# Patient Record
Sex: Female | Born: 1965 | Race: Black or African American | Hispanic: No | Marital: Married | Smoking: Former smoker
Health system: Southern US, Community
[De-identification: ages and names within clinical notes are randomized; demographics above are authoritative.]

## PROBLEM LIST (undated history)

## (undated) DIAGNOSIS — I10 Essential (primary) hypertension: Secondary | ICD-10-CM

## (undated) DIAGNOSIS — F329 Major depressive disorder, single episode, unspecified: Secondary | ICD-10-CM

## (undated) DIAGNOSIS — F32A Depression, unspecified: Secondary | ICD-10-CM

## (undated) DIAGNOSIS — E0590 Thyrotoxicosis, unspecified without thyrotoxic crisis or storm: Secondary | ICD-10-CM

## (undated) DIAGNOSIS — I219 Acute myocardial infarction, unspecified: Secondary | ICD-10-CM

## (undated) DIAGNOSIS — E079 Disorder of thyroid, unspecified: Secondary | ICD-10-CM

## (undated) HISTORY — PX: ECTOPIC PREGNANCY SURGERY: SHX613

---
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
## 2017-03-11 ENCOUNTER — Other Ambulatory Visit: Payer: Self-pay

## 2017-03-11 ENCOUNTER — Emergency Department (HOSPITAL_COMMUNITY)
Admission: EM | Admit: 2017-03-11 | Discharge: 2017-03-11 | Disposition: A | Discharge status: Home / Self Care | Payer: 59 | Attending: Emergency Medicine | Admitting: Emergency Medicine

## 2017-03-11 ENCOUNTER — Encounter (HOSPITAL_COMMUNITY): Payer: Self-pay | Admitting: Emergency Medicine

## 2017-03-11 DIAGNOSIS — I10 Essential (primary) hypertension: Secondary | ICD-10-CM

## 2017-03-11 DIAGNOSIS — R197 Diarrhea, unspecified: Secondary | ICD-10-CM

## 2017-03-11 DIAGNOSIS — Z87891 Personal history of nicotine dependence: Secondary | ICD-10-CM | POA: Insufficient documentation

## 2017-03-11 DIAGNOSIS — K921 Melena: Secondary | ICD-10-CM | POA: Insufficient documentation

## 2017-03-11 HISTORY — DX: Acute myocardial infarction, unspecified: I21.9

## 2017-03-11 HISTORY — DX: Essential (primary) hypertension: I10

## 2017-03-11 LAB — CBC
HEMATOCRIT: 37.9 % (ref 36.0–46.0)
HEMOGLOBIN: 13 g/dL (ref 12.0–15.0)
MCH: 29.5 pg (ref 26.0–34.0)
MCHC: 34.3 g/dL (ref 30.0–36.0)
MCV: 86.1 fL (ref 78.0–100.0)
Platelets: 232 10*3/uL (ref 150–400)
RBC: 4.4 MIL/uL (ref 3.87–5.11)
RDW: 13 % (ref 11.5–15.5)
WBC: 3.6 10*3/uL — ABNORMAL LOW (ref 4.0–10.5)

## 2017-03-11 LAB — COMPREHENSIVE METABOLIC PANEL
ALT: 17 U/L (ref 14–54)
AST: 23 U/L (ref 15–41)
Albumin: 4 g/dL (ref 3.5–5.0)
Alkaline Phosphatase: 73 U/L (ref 38–126)
Anion gap: 7 (ref 5–15)
BILIRUBIN TOTAL: 0.5 mg/dL (ref 0.3–1.2)
BUN: 9 mg/dL (ref 6–20)
CALCIUM: 9 mg/dL (ref 8.9–10.3)
CHLORIDE: 105 mmol/L (ref 101–111)
CO2: 26 mmol/L (ref 22–32)
CREATININE: 0.78 mg/dL (ref 0.44–1.00)
Glucose, Bld: 118 mg/dL — ABNORMAL HIGH (ref 65–99)
Potassium: 3.4 mmol/L — ABNORMAL LOW (ref 3.5–5.1)
Sodium: 138 mmol/L (ref 135–145)
TOTAL PROTEIN: 6.7 g/dL (ref 6.5–8.1)

## 2017-03-11 LAB — TYPE AND SCREEN
ABO/RH(D): O POS
Antibody Screen: NEGATIVE

## 2017-03-11 LAB — POC OCCULT BLOOD, ED: Fecal Occult Bld: NEGATIVE

## 2017-03-11 LAB — ABO/RH: ABO/RH(D): O POS

## 2017-03-11 LAB — I-STAT BETA HCG BLOOD, ED (MC, WL, AP ONLY)

## 2017-03-11 NOTE — ED Triage Notes (Signed)
Reports having blood in stool this morning and another this evening.  Denies having any other symptoms.

## 2017-03-11 NOTE — ED Provider Notes (Signed)
[facility] Provider Note   CSN: [id number] Arrival date & time: 03/11/17  0024     History   Chief Complaint Chief Complaint  Patient presents with  . Blood In Stools    HPI Molly Anderson is a 51 y.o. female.  The history is provided by the patient.  She had diarrhea yesterday morning, and again this evening.  On both occasions, after she finished her bowel movement, she had some blood that was dripping into the toilet bowl.  She denies abdominal pain, fever, chills, sweats.  She denies nausea or vomiting.  She is not sure if there was blood in the stool prior to the dripping at the end.  She denies any sick contacts.  She denies any unusual food exposures.  Past Medical History:  Diagnosis Date  . Hypertension   . MI (myocardial infarction) (HCC)     There are no active problems to display for this patient.   Past Surgical History:  Procedure Laterality Date  . ECTOPIC PREGNANCY SURGERY Left     OB History    No data available       Home Medications    Prior to Admission medications   Not on File    Family History No family history on file.  Social History Social History   Tobacco Use  . Smoking status: Former Games developermoker  . Smokeless tobacco: Never Used  Substance Use Topics  . Alcohol use: No    Frequency: Never  . Drug use: No     Allergies   Patient has no known allergies.   Review of Systems Review of Systems  All other systems reviewed and are negative.    Physical Exam Updated Vital Signs BP (!) 169/108 (BP Location: Left Arm)   Pulse 79   Temp 98.4 F (36.9 C) (Oral)   Resp 16   Ht 5\' 3"  (1.6 m)   Wt 80.3 kg (177 lb)   SpO2 100%   BMI 31.35 kg/m   Physical Exam  Nursing note and vitals reviewed.  51 year old female, resting comfortably and in no acute distress. Vital signs are significant for hypertension. Oxygen saturation is 100%, which is normal. Head is normocephalic and  atraumatic. PERRLA, EOMI. Oropharynx is clear. Neck is nontender and supple without adenopathy or JVD. Back is nontender and there is no CVA tenderness. Lungs are clear without rales, wheezes, or rhonchi. Chest is nontender. Heart has regular rate and rhythm without murmur. Abdomen is soft, flat, nontender without masses or hepatosplenomegaly and peristalsis is normoactive. Rectal: External hemorrhoids present.  Normal sphincter tone.  No stool present in the rectal ampulla. Extremities have no cyanosis or edema, full range of motion is present. Skin is warm and dry without rash. Neurologic: Mental status is normal, cranial nerves are intact, there are no motor or sensory deficits.  ED Treatments / Results  Labs (all labs ordered are listed, but only abnormal results are displayed) Labs Reviewed  COMPREHENSIVE METABOLIC PANEL - Abnormal; Notable for the following components:      Result Value   Potassium 3.4 (*)    Glucose, Bld 118 (*)    All other components within normal limits  CBC - Abnormal; Notable for the following components:   WBC 3.6 (*)    All other components within normal limits  POC OCCULT BLOOD, ED  I-STAT BETA HCG BLOOD, ED ([facility], [facility], [facility] ONLY)  POC OCCULT BLOOD, ED  TYPE AND SCREEN  ABO/RH  Procedures Procedures (including critical care time)  Medications Ordered in ED Medications - No data to display   Initial Impression / Assessment and Plan / ED Course  I have reviewed the triage vital signs and the nursing notes.  Pertinent labs & imaging results that were available during my care of the patient were reviewed by me and considered in my medical decision making (see chart for details).  Diarrhea with report of bleeding.  Based on history and exam, I suspect that bleeding is from her external hemorrhoids.  No active bleeding at the time of my exam.  Stool has been sent for Hemoccult testing.  She has no prior [facility] the [facility].  Hemoccult  is negative.  Orthostatic vital signs showed no significant change in heart rate or blood pressure.  Blood pressure is noted to be significantly elevated.  Patient takes lisinopril for blood pressure, but does not monitor her blood pressure at home.  She is discharged with instructions to take loperamide as needed for diarrhea, return precautions discussed.  Recommended that she monitor her blood pressure at home.  She states that she does not have a PCP, but recently did get insurance through her job.  She is given phone number for [facility] to try to establish primary care.  Final Clinical Impressions(s) / ED Diagnoses   Final diagnoses:  Diarrhea of presumed infectious origin  Hematochezia  Essential hypertension    ED Discharge Orders    None       Dione BoozeGlick, Stephanie Littman, MD 03/11/17 [phone]

## 2017-03-11 NOTE — Discharge Instructions (Signed)
Take loperamide (Imodium AD) as needed for diarrhea.  Please monitor your blood pressure at home. Take your blood pressure once a day, and keep a log of the readings. Take that log with you when you see your doctor.

## 2019-06-01 ENCOUNTER — Other Ambulatory Visit: Payer: Self-pay | Admitting: Gastroenterology

## 2019-06-01 DIAGNOSIS — R1032 Left lower quadrant pain: Secondary | ICD-10-CM

## 2019-06-13 ENCOUNTER — Ambulatory Visit
Admission: RE | Admit: 2019-06-13 | Discharge: 2019-06-13 | Disposition: A | Discharge status: Home / Self Care | Payer: BC Managed Care – PPO | Source: Ambulatory Visit | Attending: Gastroenterology | Admitting: Gastroenterology

## 2019-06-13 ENCOUNTER — Other Ambulatory Visit: Payer: Self-pay

## 2019-06-13 DIAGNOSIS — R1032 Left lower quadrant pain: Secondary | ICD-10-CM

## 2019-06-13 MED ORDER — IOPAMIDOL (ISOVUE-300) INJECTION 61%
100.0000 mL | Freq: Once | INTRAVENOUS | Status: AC | PRN
  Administered 2019-06-13: 100 mL via INTRAVENOUS

## 2019-06-22 ENCOUNTER — Other Ambulatory Visit: Payer: Self-pay

## 2019-06-22 ENCOUNTER — Emergency Department (HOSPITAL_COMMUNITY)
Admission: EM | Admit: 2019-06-22 | Discharge: 2019-06-23 | Disposition: A | Discharge status: Home / Self Care | Payer: BC Managed Care – PPO | Attending: Emergency Medicine | Admitting: Emergency Medicine

## 2019-06-22 ENCOUNTER — Encounter (HOSPITAL_COMMUNITY): Payer: Self-pay | Admitting: Emergency Medicine

## 2019-06-22 DIAGNOSIS — K5792 Diverticulitis of intestine, part unspecified, without perforation or abscess without bleeding: Secondary | ICD-10-CM | POA: Insufficient documentation

## 2019-06-22 DIAGNOSIS — Z7982 Long term (current) use of aspirin: Secondary | ICD-10-CM | POA: Insufficient documentation

## 2019-06-22 DIAGNOSIS — Z87891 Personal history of nicotine dependence: Secondary | ICD-10-CM | POA: Diagnosis not present

## 2019-06-22 DIAGNOSIS — Z79899 Other long term (current) drug therapy: Secondary | ICD-10-CM | POA: Diagnosis not present

## 2019-06-22 DIAGNOSIS — I10 Essential (primary) hypertension: Secondary | ICD-10-CM | POA: Insufficient documentation

## 2019-06-22 DIAGNOSIS — R1032 Left lower quadrant pain: Secondary | ICD-10-CM | POA: Diagnosis present

## 2019-06-22 HISTORY — DX: Disorder of thyroid, unspecified: E07.9

## 2019-06-22 LAB — COMPREHENSIVE METABOLIC PANEL
ALT: 14 U/L (ref 0–44)
AST: 13 U/L — ABNORMAL LOW (ref 15–41)
Albumin: 3.6 g/dL (ref 3.5–5.0)
Alkaline Phosphatase: 60 U/L (ref 38–126)
Anion gap: 6 (ref 5–15)
BUN: 7 mg/dL (ref 6–20)
CO2: 24 mmol/L (ref 22–32)
Calcium: 8.5 mg/dL — ABNORMAL LOW (ref 8.9–10.3)
Chloride: 108 mmol/L (ref 98–111)
Creatinine, Ser: 0.71 mg/dL (ref 0.44–1.00)
GFR calc Af Amer: >60 mL/min (ref >60)
GFR calc non Af Amer: >60 mL/min (ref >60)
Glucose, Bld: 158 mg/dL — ABNORMAL HIGH (ref 70–99)
Potassium: 3.4 mmol/L — ABNORMAL LOW (ref 3.5–5.1)
Sodium: 138 mmol/L (ref 135–145)
Total Bilirubin: 0.8 mg/dL (ref 0.3–1.2)
Total Protein: 6.3 g/dL — ABNORMAL LOW (ref 6.5–8.1)

## 2019-06-22 LAB — URINALYSIS, ROUTINE W REFLEX MICROSCOPIC
Bilirubin Urine: NEGATIVE
Glucose, UA: NEGATIVE mg/dL
Hgb urine dipstick: NEGATIVE
Ketones, ur: NEGATIVE mg/dL
Nitrite: NEGATIVE
Protein, ur: NEGATIVE mg/dL
Specific Gravity, Urine: 1.01 (ref 1.005–1.030)
pH: 7 (ref 5.0–8.0)

## 2019-06-22 LAB — CBC
HCT: 36.8 % (ref 36.0–46.0)
Hemoglobin: 12.3 g/dL (ref 12.0–15.0)
MCH: 29.1 pg (ref 26.0–34.0)
MCHC: 33.4 g/dL (ref 30.0–36.0)
MCV: 87.2 fL (ref 80.0–100.0)
Platelets: 283 10*3/uL (ref 150–400)
RBC: 4.22 MIL/uL (ref 3.87–5.11)
RDW: 13.4 % (ref 11.5–15.5)
WBC: 6.2 10*3/uL (ref 4.0–10.5)
nRBC: 0 % (ref 0.0–0.2)

## 2019-06-22 LAB — I-STAT BETA HCG BLOOD, ED (MC, WL, AP ONLY): I-stat hCG, quantitative: <5 m[IU]/mL (ref <5)

## 2019-06-22 LAB — LIPASE, BLOOD: Lipase: 18 U/L (ref 11–51)

## 2019-06-22 MED ORDER — SODIUM CHLORIDE 0.9% FLUSH
3.0000 mL | Freq: Once | INTRAVENOUS | Status: AC
  Administered 2019-06-23: 04:00:00 3 mL via INTRAVENOUS

## 2019-06-22 NOTE — ED Triage Notes (Signed)
Pt in POV, reports severe lower abd pain X few weeks. Saw Dr Loreta Ave with GI last week, had CT abd done showing "swelling" States Dr Loreta Ave wants to do colonoscopy but needs to wait for the swelling to go down. Pt states pain has worsened so she was instructed to come here.

## 2019-06-23 ENCOUNTER — Emergency Department (HOSPITAL_COMMUNITY): Payer: BC Managed Care – PPO

## 2019-06-23 MED ORDER — HYDROCODONE-ACETAMINOPHEN 5-325 MG PO TABS
1.0000 | ORAL_TABLET | Freq: Once | ORAL | Status: AC
  Administered 2019-06-23: 06:00:00 1 via ORAL
  Filled 2019-06-23: qty 1

## 2019-06-23 MED ORDER — ONDANSETRON HCL 4 MG/2ML IJ SOLN
4.0000 mg | Freq: Once | INTRAMUSCULAR | Status: AC
  Administered 2019-06-23: 04:00:00 4 mg via INTRAVENOUS
  Filled 2019-06-23: qty 2

## 2019-06-23 MED ORDER — FENTANYL CITRATE (PF) 100 MCG/2ML IJ SOLN
100.0000 ug | Freq: Once | INTRAMUSCULAR | Status: AC
  Administered 2019-06-23: 04:00:00 100 ug via INTRAVENOUS
  Filled 2019-06-23: qty 2

## 2019-06-23 MED ORDER — AMOXICILLIN-POT CLAVULANATE 875-125 MG PO TABS: 1.0000 | ORAL_TABLET | Freq: Two times a day (BID) | ORAL | 0 refills | Status: DC

## 2019-06-23 MED ORDER — HYDROCODONE-ACETAMINOPHEN 5-325 MG PO TABS: 2.0000 | ORAL_TABLET | Freq: Four times a day (QID) | ORAL | 0 refills | Status: DC | PRN

## 2019-06-23 MED ORDER — IOHEXOL 300 MG/ML  SOLN
100.0000 mL | Freq: Once | INTRAMUSCULAR | Status: AC | PRN
  Administered 2019-06-23: 100 mL via INTRAVENOUS

## 2019-06-23 MED ORDER — SODIUM CHLORIDE 0.9 % IV SOLN
3.0000 g | Freq: Once | INTRAVENOUS | Status: AC
  Administered 2019-06-23: 06:00:00 3 g via INTRAVENOUS
  Filled 2019-06-23 (×2): qty 8

## 2019-06-23 NOTE — ED Provider Notes (Signed)
[facility] EMERGENCY DEPARTMENT Provider Note   CSN: [id number] Arrival date & time: 06/22/19  1719     History Chief Complaint  Patient presents with  . Abdominal Pain    Molly Anderson is a 54 y.o. female.  The history is provided by the patient.  Abdominal Pain Pain location:  LLQ Pain quality: aching   Pain radiates to:  Suprapubic region Pain severity:  Severe Onset quality:  Sudden Duration:  1 day Timing:  Constant Progression:  Worsening Chronicity:  New Relieved by:  Nothing Worsened by:  Movement, palpation, coughing and position changes Associated symptoms: constipation and vomiting   Associated symptoms: no chest pain, no diarrhea, no fever, no hematemesis and no shortness of breath    Patient with history of hypertension and thyroid disease presents with abdominal pain.  She reports sudden onset of left lower quadrant abdominal pain that radiates to her suprapubic region over the past day.  She reports associated nonbloody emesis.  She reports no bowel movement for the past 2 days.  No chest pain.  Patient reports that she was placed on Cipro and Flagyl earlier this month for presumed diverticulitis.  Her gastroenterologist Dr. Loreta Ave ordered a CT scan on March 15 that revealed sigmoid wall thickening that was felt to be due to chronic diverticulitis.  Patient was placed back on Cipro and Flagyl and reports she is to have a colonoscopy in early May This pain came on suddenly yesterday and is worsening.    Past Medical History:  Diagnosis Date  . Hypertension   . MI (myocardial infarction) (HCC)   . Thyroid disease     There are no problems to display for this patient.   Past Surgical History:  Procedure Laterality Date  . ECTOPIC PREGNANCY SURGERY Left      OB History   No obstetric history on file.     No family history on file.  Social History   Tobacco Use  . Smoking status: Former Games developer  . Smokeless tobacco: Never Used   Substance Use Topics  . Alcohol use: No  . Drug use: No    Home Medications Prior to Admission medications   Medication Sig Start Date End Date Taking? Authorizing Provider  aspirin 81 MG chewable tablet Chew 81 mg by mouth daily.    [provider]  lisinopril (PRINIVIL,ZESTRIL) 10 MG tablet Take 10 mg by mouth daily.    [provider]  PRESCRIPTION MEDICATION Take 1 tablet by mouth daily. cholesterol med    [provider]    Allergies    Patient has no known allergies.  Review of Systems   Review of Systems  Constitutional: Negative for fever.  Respiratory: Negative for shortness of breath.   Cardiovascular: Negative for chest pain.  Gastrointestinal: Positive for abdominal pain, constipation and vomiting. Negative for diarrhea and hematemesis.  All other systems reviewed and are negative.   Physical Exam Updated Vital Signs BP (!) 148/89   Pulse 71   Temp 98.6 F (37 C) (Oral)   Resp 16   Ht 1.6 m (5\' 3" )   Wt 68 kg   SpO2 100%   BMI 26.57 kg/m   Physical Exam CONSTITUTIONAL: Well developed/well nourished, uncomfortable appearing HEAD: Normocephalic/atraumatic EYES: EOMI/PERRL ENMT: Mucous membranes moist NECK: supple no meningeal signs SPINE/BACK:entire spine nontender CV: S1/S2 noted, no murmurs/rubs/gallops noted LUNGS: Lungs are clear to auscultation bilaterally, no apparent distress ABDOMEN: soft, moderate tenderness in the left lower quadrant, no rebound  or guarding, bowel sounds noted throughout abdomen GU:no cva tenderness NEURO: Pt is awake/alert/appropriate, moves all extremitiesx4.  No facial droop.   EXTREMITIES: pulses normal/equal, full ROM SKIN: warm, color normal PSYCH: no abnormalities of mood noted, alert and oriented to situation  ED Results / Procedures / Treatments   Labs (all labs ordered are listed, but only abnormal results are displayed) Labs Reviewed  COMPREHENSIVE METABOLIC PANEL - Abnormal; Notable  for the following components:      Result Value   Potassium 3.4 (*)    Glucose, Bld 158 (*)    Calcium 8.5 (*)    Total Protein 6.3 (*)    AST 13 (*)    All other components within normal limits  URINALYSIS, ROUTINE W REFLEX MICROSCOPIC - Abnormal; Notable for the following components:   APPearance HAZY (*)    Leukocytes,Ua MODERATE (*)    Bacteria, UA RARE (*)    All other components within normal limits  LIPASE, BLOOD  CBC  I-STAT BETA HCG BLOOD, ED ([facility], [facility], [facility] ONLY)    EKG None  Radiology CT ABDOMEN PELVIS W CONTRAST  Result Date: 06/23/2019 CLINICAL DATA:  Acute nonlocalized abdominal pain EXAM: CT ABDOMEN AND PELVIS WITH CONTRAST TECHNIQUE: Multidetector CT imaging of the abdomen and pelvis was performed using the standard protocol following bolus administration of intravenous contrast. CONTRAST:  147mL OMNIPAQUE IOHEXOL 300 MG/ML  SOLN COMPARISON:  Ten days ago FINDINGS: Lower chest:  No contributory findings. Hepatobiliary: No focal liver abnormality.No evidence of biliary obstruction or stone. Pancreas: Unremarkable. Spleen: Unremarkable. Adrenals/Urinary Tract: Negative adrenals. No hydronephrosis or stone. Relative right renal cortical thinning with patchy lower pole scarring. Unremarkable bladder. Stomach/Bowel: Segment of proximal sigmoid colonic wall thickening (with submucosal low-density) with regional peritoneal thickening and mild fat reticulation. There are multiple colonic diverticula at this level. No obstruction. No appendicitis. Vascular/Lymphatic: No acute vascular abnormality. Aortic and iliac atherosclerotic calcification. No mass or adenopathy. Reproductive:Negative Other: No ascites or pneumoperitoneum. Musculoskeletal: No acute abnormalities. IMPRESSION: 1. Unchanged sigmoid wall thickening with regional fat stranding and peritoneal thickening favoring inflammatory process (especially diverticulitis) rather than mass. Recommend GI follow-up for colonoscopy. 2.  Right renal scarring. Electronically Signed   By: Monte Fantasia M.D.   On: 06/23/2019 04:21    Procedures Procedures   Medications Ordered in ED Medications  sodium chloride flush (NS) 0.9 % injection 3 mL (3 mLs Intravenous Given 06/23/19 0350)  fentaNYL (SUBLIMAZE) injection 100 mcg (100 mcg Intravenous Given 06/23/19 0346)  ondansetron (ZOFRAN) injection 4 mg (4 mg Intravenous Given 06/23/19 0346)  iohexol (OMNIPAQUE) 300 MG/ML solution 100 mL (100 mLs Intravenous Contrast Given 06/23/19 0407)  Ampicillin-Sulbactam (UNASYN) 3 g in sodium chloride 0.9 % 100 mL IVPB (0 g Intravenous Stopped 06/23/19 0612)  HYDROcodone-acetaminophen (NORCO/VICODIN) 5-325 MG per tablet 1 tablet (1 tablet Oral Given 06/23/19 0532)    ED Course  I have reviewed the triage vital signs and the nursing notes.  Pertinent labs & imaging results that were available during my care of the patient were reviewed by me and considered in my medical decision making (see chart for details).    MDM Rules/Calculators/A&P                      4:34 AM Patient presented with sudden onset of left lower quadrant abdominal pain that has significant tenderness on exam.  Previous CT imaging on March 15 revealed possible chronic diverticulitis.  Given her change in pain and exam findings,  I was concerned for ruptured diverticulitis.  Emergent CT scan was ordered. CT scan reveals continued sigmoid wall thickening, but no other acute findings. I will change her antibiotics and give her a dose of Unasyn here. Will try p.o. challenge. 6:15 AM Patient improved.  She is taking p.o. fluids.  She feels pain is improving. She prefers to go home.  Due to persistent symptoms, we will try a new antibiotic.  She will stop Cipro and Flagyl and will start Augmentin. She is already scheduled for a colonoscopy in May. We discussed strict ER return precautions Final Clinical Impression(s) / ED Diagnoses Final diagnoses:  Diverticulitis    Rx  / DC Orders ED Discharge Orders         Ordered    amoxicillin-clavulanate (AUGMENTIN) 875-125 MG tablet  2 times daily     06/23/19 0614    HYDROcodone-acetaminophen (NORCO/VICODIN) 5-325 MG tablet  Every 6 hours PRN     06/23/19 7510           Zadie Rhine, MD 06/23/19 [phone]

## 2019-06-23 NOTE — ED Notes (Signed)
Contacted pharmacy regarding Unasyn; pharm stated they would send medication shortly.

## 2019-06-30 ENCOUNTER — Encounter (HOSPITAL_COMMUNITY): Payer: Self-pay | Admitting: Emergency Medicine

## 2019-06-30 ENCOUNTER — Emergency Department (HOSPITAL_COMMUNITY)
Admission: EM | Admit: 2019-06-30 | Discharge: 2019-06-30 | Disposition: A | Discharge status: Home / Self Care | Payer: BC Managed Care – PPO | Attending: Emergency Medicine | Admitting: Emergency Medicine

## 2019-06-30 ENCOUNTER — Other Ambulatory Visit: Payer: Self-pay

## 2019-06-30 DIAGNOSIS — I252 Old myocardial infarction: Secondary | ICD-10-CM | POA: Insufficient documentation

## 2019-06-30 DIAGNOSIS — Z79899 Other long term (current) drug therapy: Secondary | ICD-10-CM | POA: Insufficient documentation

## 2019-06-30 DIAGNOSIS — I10 Essential (primary) hypertension: Secondary | ICD-10-CM | POA: Diagnosis not present

## 2019-06-30 DIAGNOSIS — K5792 Diverticulitis of intestine, part unspecified, without perforation or abscess without bleeding: Secondary | ICD-10-CM | POA: Diagnosis not present

## 2019-06-30 DIAGNOSIS — R1032 Left lower quadrant pain: Secondary | ICD-10-CM | POA: Diagnosis present

## 2019-06-30 DIAGNOSIS — Z87891 Personal history of nicotine dependence: Secondary | ICD-10-CM | POA: Diagnosis not present

## 2019-06-30 LAB — COMPREHENSIVE METABOLIC PANEL
ALT: 23 U/L (ref 0–44)
AST: 21 U/L (ref 15–41)
Albumin: 3.6 g/dL (ref 3.5–5.0)
Alkaline Phosphatase: 54 U/L (ref 38–126)
Anion gap: 10 (ref 5–15)
BUN: 6 mg/dL (ref 6–20)
CO2: 23 mmol/L (ref 22–32)
Calcium: 9 mg/dL (ref 8.9–10.3)
Chloride: 109 mmol/L (ref 98–111)
Creatinine, Ser: 0.59 mg/dL (ref 0.44–1.00)
GFR calc Af Amer: >60 mL/min (ref >60)
GFR calc non Af Amer: >60 mL/min (ref >60)
Glucose, Bld: 99 mg/dL (ref 70–99)
Potassium: 3.7 mmol/L (ref 3.5–5.1)
Sodium: 142 mmol/L (ref 135–145)
Total Bilirubin: 0.6 mg/dL (ref 0.3–1.2)
Total Protein: 6.3 g/dL — ABNORMAL LOW (ref 6.5–8.1)

## 2019-06-30 LAB — CBC WITH DIFFERENTIAL/PLATELET
Abs Immature Granulocytes: 0.01 10*3/uL (ref 0.00–0.07)
Basophils Absolute: 0 10*3/uL (ref 0.0–0.1)
Basophils Relative: 1 %
Eosinophils Absolute: 0.1 10*3/uL (ref 0.0–0.5)
Eosinophils Relative: 1 %
HCT: 37.2 % (ref 36.0–46.0)
Hemoglobin: 12.2 g/dL (ref 12.0–15.0)
Immature Granulocytes: 0 %
Lymphocytes Relative: 15 %
Lymphs Abs: 0.8 10*3/uL (ref 0.7–4.0)
MCH: 29.2 pg (ref 26.0–34.0)
MCHC: 32.8 g/dL (ref 30.0–36.0)
MCV: 89 fL (ref 80.0–100.0)
Monocytes Absolute: 0.4 10*3/uL (ref 0.1–1.0)
Monocytes Relative: 7 %
Neutro Abs: 4.1 10*3/uL (ref 1.7–7.7)
Neutrophils Relative %: 76 %
Platelets: 292 10*3/uL (ref 150–400)
RBC: 4.18 MIL/uL (ref 3.87–5.11)
RDW: 13.3 % (ref 11.5–15.5)
WBC: 5.4 10*3/uL (ref 4.0–10.5)
nRBC: 0 % (ref 0.0–0.2)

## 2019-06-30 MED ORDER — HYDROCODONE-ACETAMINOPHEN 5-325 MG PO TABS: 1.0000 | ORAL_TABLET | ORAL | 0 refills | Status: DC | PRN

## 2019-06-30 MED ORDER — DICYCLOMINE HCL 20 MG PO TABS: 20.0000 mg | ORAL_TABLET | Freq: Two times a day (BID) | ORAL | 0 refills | Status: DC

## 2019-06-30 MED ORDER — SODIUM CHLORIDE 0.9 % IV BOLUS
1000.0000 mL | Freq: Once | INTRAVENOUS | Status: AC
  Administered 2019-06-30: 08:00:00 1000 mL via INTRAVENOUS

## 2019-06-30 MED ORDER — ONDANSETRON HCL 4 MG/2ML IJ SOLN
4.0000 mg | Freq: Once | INTRAMUSCULAR | Status: AC
  Administered 2019-06-30: 4 mg via INTRAVENOUS
  Filled 2019-06-30: qty 2

## 2019-06-30 MED ORDER — DIPHENHYDRAMINE HCL 50 MG/ML IJ SOLN
12.5000 mg | Freq: Once | INTRAMUSCULAR | Status: AC
  Administered 2019-06-30: 09:00:00 12.5 mg via INTRAVENOUS
  Filled 2019-06-30: qty 1

## 2019-06-30 MED ORDER — DICYCLOMINE HCL 10 MG/ML IM SOLN
20.0000 mg | Freq: Once | INTRAMUSCULAR | Status: AC
  Administered 2019-06-30: 20 mg via INTRAMUSCULAR
  Filled 2019-06-30: qty 2

## 2019-06-30 MED ORDER — MORPHINE SULFATE (PF) 4 MG/ML IV SOLN
4.0000 mg | Freq: Once | INTRAVENOUS | Status: AC
  Administered 2019-06-30: 4 mg via INTRAVENOUS
  Filled 2019-06-30: qty 1

## 2019-06-30 MED ORDER — ONDANSETRON 4 MG PO TBDP: 4.0000 mg | ORAL_TABLET | Freq: Three times a day (TID) | ORAL | 0 refills | Status: DC | PRN

## 2019-06-30 NOTE — Discharge Instructions (Signed)
Take the Bentyl and Zofran as prescribed to help with your symptoms. Continue taking the antibiotics as directed. It is likely that your symptoms are due to your known diverticulitis.  It is important for you to follow-up with your GI doctor for colonoscopy and other management. Slowly increase your diet as tolerated. Return to the ED if you start to experience fever, chest pain, shortness of breath, bloody stools or vomiting.

## 2019-06-30 NOTE — ED Notes (Signed)
Reddened area above IV -- PA notified, meds ordered.

## 2019-06-30 NOTE — ED Triage Notes (Signed)
  Patient comes in with lower abdominal pain that has been going on for several days.  Patient states she was seen last week and diagnosed with diverticulitis, and sent home with antibiotics.  Patient states the pain has gotten worse in her lower abdomen and she would like to figure out what is going on.  Patient has been taking antibiotic as prescribed.  Pain 7/10, sharp/tender.

## 2019-06-30 NOTE — ED Provider Notes (Signed)
[place] EMERGENCY DEPARTMENT Provider Note   CSN: [id number] Arrival date & time: 06/30/19  3762     History Chief Complaint  Patient presents with  . Abdominal Pain    Molly Anderson is a 54 y.o. female with past medical history of hypertension, diverticulosis presenting to the ED with a chief complaint of continued abdominal pain.  For the past 2 weeks has been having left lower quadrant abdominal pain with mucus diarrhea and nausea.  Was seen and evaluated by GI 2 weeks ago, diagnosed with diverticulitis (?chronic) and started on Cipro and Flagyl.  She then presented again to the ER on 06/23/2019 with continued symptoms and switched to Augmentin.  She has not been taking anything for nausea vomiting or pain at home.  She was told by her GI doctor not to take anything for pain because "you should not mask what is going on."  She denies any urinary symptoms, fever, chest pain, shortness of breath, prior abdominal surgeries, vaginal complaints.  Her symptoms have not worsened but have been essentially unchanged since he started taking the Augmentin on 06/23/2019.  HPI     Past Medical History:  Diagnosis Date  . Hypertension   . MI (myocardial infarction) ([place])   . Thyroid disease     There are no problems to display for this patient.   Past Surgical History:  Procedure Laterality Date  . ECTOPIC PREGNANCY SURGERY Left      OB History   No obstetric history on file.     History reviewed. No pertinent family history.  Social History   Tobacco Use  . Smoking status: Former Research scientist (life sciences)  . Smokeless tobacco: Never Used  Substance Use Topics  . Alcohol use: No  . Drug use: No    Home Medications Prior to Admission medications   Medication Sig Start Date End Date Taking? Authorizing Provider  amoxicillin-clavulanate (AUGMENTIN) 875-125 MG tablet Take 1 tablet by mouth 2 (two) times daily. One po bid x 7 days 06/23/19  Yes Ripley Fraise, MD  lisinopril  (ZESTRIL) 20 MG tablet Take 20 mg by mouth every morning. 05/06/19  Yes [provider]  sertraline (ZOLOFT) 25 MG tablet Take 25 mg by mouth daily. 05/06/19  Yes [provider]  dicyclomine (BENTYL) 20 MG tablet Take 1 tablet (20 mg total) by mouth 2 (two) times daily. 06/30/19   Xaidyn Kepner, PA-C  ondansetron (ZOFRAN ODT) 4 MG disintegrating tablet Take 1 tablet (4 mg total) by mouth every 8 (eight) hours as needed for nausea or vomiting. 06/30/19   Juhi Lagrange, PA-C    Allergies    Morphine and related  Review of Systems   Review of Systems  Constitutional: Negative for appetite change, chills and fever.  HENT: Negative for ear pain, rhinorrhea, sneezing and sore throat.   Eyes: Negative for photophobia and visual disturbance.  Respiratory: Negative for cough, chest tightness, shortness of breath and wheezing.   Cardiovascular: Negative for chest pain and palpitations.  Gastrointestinal: Positive for abdominal pain, diarrhea, nausea and vomiting. Negative for blood in stool and constipation.  Genitourinary: Negative for dysuria, hematuria and urgency.  Musculoskeletal: Negative for myalgias.  Skin: Negative for rash.  Neurological: Negative for dizziness, weakness and light-headedness.    Physical Exam Updated Vital Signs BP (!) 177/91 (BP Location: Right Arm)   Pulse 65   Temp 98.3 F (36.8 C) (Oral)   Resp (!) 21   Ht 5\' 3"  (1.6 m)   Wt 66.2  kg   SpO2 100%   BMI 25.86 kg/m   Physical Exam Vitals and nursing note reviewed.  Constitutional:      General: She is not in acute distress.    Appearance: She is well-developed.  HENT:     Head: Normocephalic and atraumatic.     Nose: Nose normal.  Eyes:     General: No scleral icterus.       Left eye: No discharge.     Conjunctiva/sclera: Conjunctivae normal.  Cardiovascular:     Rate and Rhythm: Normal rate and regular rhythm.     Heart sounds: Normal heart sounds. No murmur. No friction rub. No gallop.     Pulmonary:     Effort: Pulmonary effort is normal. No respiratory distress.     Breath sounds: Normal breath sounds.  Abdominal:     General: Bowel sounds are normal. There is no distension.     Palpations: Abdomen is soft.     Tenderness: There is abdominal tenderness in the left lower quadrant. There is no guarding.  Musculoskeletal:        General: Normal range of motion.     Cervical back: Normal range of motion and neck supple.  Skin:    General: Skin is warm and dry.     Findings: No rash.  Neurological:     Mental Status: She is alert.     Motor: No abnormal muscle tone.     Coordination: Coordination normal.     ED Results / Procedures / Treatments   Labs (all labs ordered are listed, but only abnormal results are displayed) Labs Reviewed  COMPREHENSIVE METABOLIC PANEL - Abnormal; Notable for the following components:      Result Value   Total Protein 6.3 (*)    All other components within normal limits  CBC WITH DIFFERENTIAL/PLATELET    EKG None  Radiology No results found.  Procedures Procedures (including critical care time)  Medications Ordered in ED Medications  ondansetron (ZOFRAN) injection 4 mg (4 mg Intravenous Given 06/30/19 0757)  sodium chloride 0.9 % bolus 1,000 mL (1,000 mLs Intravenous New Bag/Given 06/30/19 0758)  morphine 4 MG/ML injection 4 mg (4 mg Intravenous Given 06/30/19 0757)  diphenhydrAMINE (BENADRYL) injection 12.5 mg (12.5 mg Intravenous Given 06/30/19 0834)  dicyclomine (BENTYL) injection 20 mg (20 mg Intramuscular Given 06/30/19 1540)    ED Course  I have reviewed the triage vital signs and the nursing notes.  Pertinent labs & imaging results that were available during my care of the patient were reviewed by me and considered in my medical decision making (see chart for details).  Clinical Course as of Jun 29 1001  Thu Jun 30, 2019  0827 Patient with some area of hives after giving morphine near IV site. States some pruritis. No  signs of angioedema or anaphylaxis. She has not had morphine in the past. Will give Benadryl and continue to monitor her symptoms.   [HK]  0833 WBC: 5.4 [HK]    Clinical Course User Index [HK] Dietrich Pates, PA-C   MDM Rules/Calculators/A&P                      [phone] F with a past medical history of diverticulosis, presenting to the ED with a chief complaint of continued abdominal pain, diarrhea and nausea.  Evaluated by GI 2 weeks ago, diagnosed with possible chronic diverticulitis and started on ciprofloxacin and Flagyl.  Presented to the ER again on 06/23/2019 with continued  symptoms and switched to Augmentin.  She has not been taking anything for the vomiting or pain at home.  Denies any urinary symptoms, fever, vaginal complaints.  On my exam there is tenderness location of the left lower quadrant without rebound or guarding.  She denies history of kidney stones.  She is afebrile without recent use of antipyretics.  Repeat lab work today including CMP, CBC are unremarkable.  Chart review shows that she has had 2 CT scans in the past 2 weeks for the symptoms and ultimately should obtain a colonoscopy for further visualization.  I had a long discussion with the patient regarding risk and benefits for reimaging at this time.  Since her symptoms have not significantly worsened, she is afebrile and no leukocytosis I doubt that there is some complication of her diverticulitis such as abscess or perforation, and patient is agreeable to continuing symptoms control.  Symptoms were controlled here with Zofran and Bentyl, IV fluids.  She may have had a an allergic reaction to morphine as she developed several hives around her IV site.  However no signs of angioedema or anaphylaxis. I have added this to her allergy list. Feel that she will benefit from Bentyl and Zofran at home as well as continued antibiotic therapy and increasing fluid intake.  Patient is scheduled to follow-up with her GI doctor in 1 month.  We  will have her contact for any sooner appointments and return to the ER for worsening symptoms.  Patient is hemodynamically stable, in NAD. Evaluation does not show pathology that would require ongoing emergent intervention or inpatient treatment. I have personally reviewed and interpreted all lab work and imaging at today's ED visit. I explained the diagnosis to the patient. Pain has been managed and has no complaints prior to discharge. Patient is comfortable with above plan and is stable for discharge at this time. All questions were answered prior to disposition. Strict return precautions for returning to the ED were discussed. Encouraged follow up with PCP.   An After Visit Summary was printed and given to the patient.   Portions of this note were generated with Scientist, clinical (histocompatibility and immunogenetics). Dictation errors may occur despite best attempts at proofreading.  Final Clinical Impression(s) / ED Diagnoses Final diagnoses:  Diverticulitis    Rx / DC Orders ED Discharge Orders         Ordered    dicyclomine (BENTYL) 20 MG tablet  2 times daily     06/30/19 0958    ondansetron (ZOFRAN ODT) 4 MG disintegrating tablet  Every 8 hours PRN     06/30/19 0958           Dietrich Pates, PA-C 06/30/19 1003    Sabas Sous, MD 07/04/19 [phone]

## 2019-08-11 ENCOUNTER — Emergency Department (HOSPITAL_COMMUNITY): Payer: BC Managed Care – PPO

## 2019-08-11 ENCOUNTER — Inpatient Hospital Stay (HOSPITAL_COMMUNITY)
Admission: EM | Admit: 2019-08-11 | Discharge: 2019-08-17 | DRG: 392 | Disposition: A | Discharge status: Home / Self Care | Payer: BC Managed Care – PPO | Attending: Internal Medicine | Admitting: Internal Medicine

## 2019-08-11 ENCOUNTER — Other Ambulatory Visit: Payer: Self-pay

## 2019-08-11 ENCOUNTER — Encounter (HOSPITAL_COMMUNITY): Payer: Self-pay | Admitting: Pediatrics

## 2019-08-11 DIAGNOSIS — K5792 Diverticulitis of intestine, part unspecified, without perforation or abscess without bleeding: Secondary | ICD-10-CM | POA: Diagnosis present

## 2019-08-11 DIAGNOSIS — Z87891 Personal history of nicotine dependence: Secondary | ICD-10-CM

## 2019-08-11 DIAGNOSIS — K5720 Diverticulitis of large intestine with perforation and abscess without bleeding: Secondary | ICD-10-CM | POA: Diagnosis not present

## 2019-08-11 DIAGNOSIS — Z79899 Other long term (current) drug therapy: Secondary | ICD-10-CM

## 2019-08-11 DIAGNOSIS — E876 Hypokalemia: Secondary | ICD-10-CM | POA: Diagnosis present

## 2019-08-11 DIAGNOSIS — I10 Essential (primary) hypertension: Secondary | ICD-10-CM | POA: Diagnosis present

## 2019-08-11 DIAGNOSIS — R1032 Left lower quadrant pain: Secondary | ICD-10-CM | POA: Diagnosis not present

## 2019-08-11 DIAGNOSIS — K5900 Constipation, unspecified: Secondary | ICD-10-CM | POA: Diagnosis present

## 2019-08-11 DIAGNOSIS — Z885 Allergy status to narcotic agent status: Secondary | ICD-10-CM

## 2019-08-11 DIAGNOSIS — K651 Peritoneal abscess: Secondary | ICD-10-CM | POA: Diagnosis present

## 2019-08-11 DIAGNOSIS — I252 Old myocardial infarction: Secondary | ICD-10-CM

## 2019-08-11 DIAGNOSIS — Z20822 Contact with and (suspected) exposure to covid-19: Secondary | ICD-10-CM | POA: Diagnosis present

## 2019-08-11 LAB — URINALYSIS, ROUTINE W REFLEX MICROSCOPIC
Bilirubin Urine: NEGATIVE
Glucose, UA: NEGATIVE mg/dL
Hgb urine dipstick: NEGATIVE
Ketones, ur: NEGATIVE mg/dL
Leukocytes,Ua: NEGATIVE
Nitrite: NEGATIVE
Protein, ur: NEGATIVE mg/dL
Specific Gravity, Urine: 1.011 (ref 1.005–1.030)
pH: 6 (ref 5.0–8.0)

## 2019-08-11 LAB — CBC
HCT: 38.2 % (ref 36.0–46.0)
Hemoglobin: 12.5 g/dL (ref 12.0–15.0)
MCH: 28.7 pg (ref 26.0–34.0)
MCHC: 32.7 g/dL (ref 30.0–36.0)
MCV: 87.8 fL (ref 80.0–100.0)
Platelets: 252 10*3/uL (ref 150–400)
RBC: 4.35 MIL/uL (ref 3.87–5.11)
RDW: 13 % (ref 11.5–15.5)
WBC: 6.4 10*3/uL (ref 4.0–10.5)
nRBC: 0 % (ref 0.0–0.2)

## 2019-08-11 LAB — COMPREHENSIVE METABOLIC PANEL
ALT: 13 U/L (ref 0–44)
AST: 13 U/L — ABNORMAL LOW (ref 15–41)
Albumin: 3.9 g/dL (ref 3.5–5.0)
Alkaline Phosphatase: 65 U/L (ref 38–126)
Anion gap: 15 (ref 5–15)
BUN: 6 mg/dL (ref 6–20)
CO2: 21 mmol/L — ABNORMAL LOW (ref 22–32)
Calcium: 8.8 mg/dL — ABNORMAL LOW (ref 8.9–10.3)
Chloride: 104 mmol/L (ref 98–111)
Creatinine, Ser: 0.61 mg/dL (ref 0.44–1.00)
GFR calc Af Amer: >60 mL/min (ref >60)
GFR calc non Af Amer: >60 mL/min (ref >60)
Glucose, Bld: 103 mg/dL — ABNORMAL HIGH (ref 70–99)
Potassium: 3.2 mmol/L — ABNORMAL LOW (ref 3.5–5.1)
Sodium: 140 mmol/L (ref 135–145)
Total Bilirubin: 0.8 mg/dL (ref 0.3–1.2)
Total Protein: 6.7 g/dL (ref 6.5–8.1)

## 2019-08-11 LAB — SARS CORONAVIRUS 2 BY RT PCR (HOSPITAL ORDER, PERFORMED IN ~~LOC~~ HOSPITAL LAB): SARS Coronavirus 2: NEGATIVE

## 2019-08-11 LAB — LACTIC ACID, PLASMA
Lactic Acid, Venous: 0.7 mmol/L (ref 0.5–1.9)
Lactic Acid, Venous: 0.9 mmol/L (ref 0.5–1.9)

## 2019-08-11 LAB — LIPASE, BLOOD: Lipase: 17 U/L (ref 11–51)

## 2019-08-11 MED ORDER — FENTANYL CITRATE (PF) 100 MCG/2ML IJ SOLN
25.0000 ug | Freq: Once | INTRAMUSCULAR | Status: AC
  Administered 2019-08-11: 25 ug via INTRAVENOUS
  Filled 2019-08-11: qty 2

## 2019-08-11 MED ORDER — IOHEXOL 300 MG/ML  SOLN
100.0000 mL | Freq: Once | INTRAMUSCULAR | Status: AC | PRN
  Administered 2019-08-11: 100 mL via INTRAVENOUS

## 2019-08-11 MED ORDER — METRONIDAZOLE IN NACL 5-0.79 MG/ML-% IV SOLN
500.0000 mg | Freq: Once | INTRAVENOUS | Status: AC
  Administered 2019-08-11: 500 mg via INTRAVENOUS
  Filled 2019-08-11: qty 100

## 2019-08-11 MED ORDER — FENTANYL CITRATE (PF) 100 MCG/2ML IJ SOLN
50.0000 ug | INTRAMUSCULAR | Status: DC | PRN
  Administered 2019-08-12 – 2019-08-15 (×7): 50 ug via INTRAVENOUS
  Filled 2019-08-11 (×9): qty 2

## 2019-08-11 MED ORDER — ONDANSETRON HCL 4 MG/2ML IJ SOLN
4.0000 mg | Freq: Once | INTRAMUSCULAR | Status: AC
  Administered 2019-08-11: 4 mg via INTRAVENOUS
  Filled 2019-08-11: qty 2

## 2019-08-11 MED ORDER — SODIUM CHLORIDE 0.9 % IV BOLUS
1000.0000 mL | Freq: Once | INTRAVENOUS | Status: AC
  Administered 2019-08-11: 1000 mL via INTRAVENOUS

## 2019-08-11 MED ORDER — SODIUM CHLORIDE 0.9% FLUSH
3.0000 mL | Freq: Once | INTRAVENOUS | Status: AC
  Administered 2019-08-12: 3 mL via INTRAVENOUS

## 2019-08-11 MED ORDER — SODIUM CHLORIDE 0.9 % IV SOLN
2.0000 g | Freq: Once | INTRAVENOUS | Status: AC
  Administered 2019-08-11: 2 g via INTRAVENOUS
  Filled 2019-08-11: qty 20

## 2019-08-11 MED ORDER — SODIUM CHLORIDE 0.9 % IV SOLN: Freq: Once | INTRAVENOUS | Status: AC

## 2019-08-11 MED ORDER — ACETAMINOPHEN 325 MG PO TABS: 650.0000 mg | ORAL_TABLET | Freq: Once | ORAL | Status: DC

## 2019-08-11 NOTE — ED Provider Notes (Signed)
[facility] EMERGENCY DEPARTMENT Provider Note   CSN: [id number] Arrival date & time: 08/11/19  1753     History Chief Complaint  Patient presents with  . Abdominal Pain    Molly Anderson is a 54 y.o. female with past medical history significant for diverticulitis who presents to the ED with 1 day history of LLQ abdominal pain.  Patient reports that yesterday at approximately 3 PM she developed acute onset left lower quadrant pain that radiates across her lower abdomen.  She also endorses subjective fevers and chills, diminished appetite, and nausea symptoms.  She states that she has had a history of diverticulitis and has been experiencing the symptoms on and off for approximately 6 months.  She has a gastroenterologist and they are planning for colonoscopy, however she states that they keep getting pushed back due to her flares.  She is tearful on examination and "knows something is wrong".  Evidently she was seen by gastroenterology, Dr. Loreta Ave, and treated with ciprofloxacin and Flagyl.  She states that that made her sick to her stomach and she was ultimately switched to Augmentin.  I reviewed patient's medical record and she was recently evaluated in the ER on 06/23/2019 and 06/30/2019 for similar complaints.  On her subsequent evaluation, given that her symptoms had not worsened, they did not obtain repeat imaging.  She states that her symptoms relatively abated, however they are back with a vengeance in the past 24 hours.  Patient was supposed to have a gastroenterology appointment on 08/02/2019, but states that she was unable to attend.  Patient is also endorsing urinary symptoms.  Last bowel movement was 3 days ago and relatively normal-appearing.  She states that her gastroenterologist wanted her to take Linzess for possible constipation, however patient states that they failed to improve her symptoms and they have only gotten worse.  She denies any chest pain shortness of breath,  cough, inability to tolerate food or liquid by mouth, hematochezia, melena, or other symptoms.  HPI     Past Medical History:  Diagnosis Date  . Hypertension   . MI (myocardial infarction) (HCC)   . Thyroid disease     There are no problems to display for this patient.   Past Surgical History:  Procedure Laterality Date  . ECTOPIC PREGNANCY SURGERY Left      OB History   No obstetric history on file.     No family history on file.  Social History   Tobacco Use  . Smoking status: Former Games developer  . Smokeless tobacco: Never Used  Substance Use Topics  . Alcohol use: No  . Drug use: No    Home Medications Prior to Admission medications   Medication Sig Start Date End Date Taking? Authorizing Provider  amoxicillin-clavulanate (AUGMENTIN) 875-125 MG tablet Take 1 tablet by mouth 2 (two) times daily. One po bid x 7 days 06/23/19   Zadie Rhine, MD  dicyclomine (BENTYL) 20 MG tablet Take 1 tablet (20 mg total) by mouth 2 (two) times daily. 06/30/19   Khatri, Hina, PA-C  HYDROcodone-acetaminophen (NORCO/VICODIN) 5-325 MG tablet Take 1 tablet by mouth every 4 (four) hours as needed. 06/30/19   Sabas Sous, MD  lisinopril (ZESTRIL) 20 MG tablet Take 20 mg by mouth every morning. 05/06/19   [provider]  ondansetron (ZOFRAN ODT) 4 MG disintegrating tablet Take 1 tablet (4 mg total) by mouth every 8 (eight) hours as needed for nausea or vomiting. 06/30/19   Dietrich Pates, PA-C  sertraline (ZOLOFT) 25 MG tablet Take 25 mg by mouth daily. 05/06/19   [provider]    Allergies    Morphine and related  Review of Systems   Review of Systems  Constitutional: Positive for chills and fever.  Gastrointestinal: Positive for abdominal pain and nausea. Negative for rectal pain.  Genitourinary: Positive for dysuria. Negative for flank pain.  Musculoskeletal: Negative for back pain.    Physical Exam Updated Vital Signs BP (!) 147/90 (BP Location: Right Arm)    Pulse 78   Temp 98.5 F (36.9 C) (Oral)   Resp 16   Ht 5\' 3"  (1.6 m)   Wt 60.3 kg   SpO2 100%   BMI 23.56 kg/m   Physical Exam Vitals and nursing note reviewed. Exam conducted with a chaperone present.  Constitutional:      General: She is not in acute distress.    Appearance: Normal appearance.  HENT:     Head: Normocephalic and atraumatic.  Eyes:     General: No scleral icterus.    Conjunctiva/sclera: Conjunctivae normal.  Cardiovascular:     Rate and Rhythm: Normal rate and regular rhythm.     Pulses: Normal pulses.     Heart sounds: Normal heart sounds.  Pulmonary:     Effort: Pulmonary effort is normal. No respiratory distress.     Breath sounds: Normal breath sounds. No wheezing or rales.  Abdominal:     Comments: Soft, nondistended.  Focal TTP in LLQ and suprapubic region.  No TTP elsewhere.  No guarding.  No overlying skin changes.  Normoactive bowel sounds.  Skin:    General: Skin is dry.     Capillary Refill: Capillary refill takes less than 2 seconds.  Neurological:     Mental Status: She is alert and oriented to person, place, and time.     GCS: GCS eye subscore is 4. GCS verbal subscore is 5. GCS motor subscore is 6.  Psychiatric:        Mood and Affect: Mood normal.        Behavior: Behavior normal.        Thought Content: Thought content normal.     ED Results / Procedures / Treatments   Labs (all labs ordered are listed, but only abnormal results are displayed) Labs Reviewed  COMPREHENSIVE METABOLIC PANEL - Abnormal; Notable for the following components:      Result Value   Potassium 3.2 (*)    CO2 21 (*)    Glucose, Bld 103 (*)    Calcium 8.8 (*)    AST 13 (*)    All other components within normal limits  SARS CORONAVIRUS 2 BY RT PCR (HOSPITAL ORDER, [facility])  CULTURE, BLOOD (ROUTINE X 2)  CULTURE, BLOOD (ROUTINE X 2)  LIPASE, BLOOD  CBC  URINALYSIS, ROUTINE W REFLEX MICROSCOPIC  LACTIC ACID, PLASMA  LACTIC  ACID, PLASMA    EKG None  Radiology CT ABDOMEN PELVIS W CONTRAST  Result Date: 08/11/2019 CLINICAL DATA:  Abdominal pain for 6 months question of diverticulitis. EXAM: CT ABDOMEN AND PELVIS WITH CONTRAST TECHNIQUE: Multidetector CT imaging of the abdomen and pelvis was performed using the standard protocol following bolus administration of intravenous contrast. CONTRAST:  142mL OMNIPAQUE IOHEXOL 300 MG/ML  SOLN COMPARISON:  Most recent prior June 23, 2019 FINDINGS: Lower chest: The visualized heart size within normal limits. No pericardial fluid/thickening. No hiatal hernia. The visualized portions of the lungs are clear. Hepatobiliary: The liver is  normal in density without focal abnormality.The main portal vein is patent. No evidence of calcified gallstones, gallbladder wall thickening or biliary dilatation. Pancreas: Unremarkable. No pancreatic ductal dilatation or surrounding inflammatory changes. Spleen: Normal in size without focal abnormality. Adrenals/Urinary Tract: Both adrenal glands appear normal. The kidneys and collecting system appear normal without evidence of urinary tract calculus or hydronephrosis. Bladder is unremarkable. Stomach/Bowel: The stomach and small bowel are normal in appearance. The appendix is unremarkable. Again noted is a long segment of sigmoid colon with diffuse wall thickening surrounding colonic diverticula. And adjacent mesenteric fat stranding changes with peritoneal thickening. There does however appear to be a new adjacent posterior multilocular fluid collection measuring approximately 3.5 x 1.5 cm, series 3, image 64, likely pericolonic abscess. Vascular/Lymphatic: There are no enlarged mesenteric, retroperitoneal, or pelvic lymph nodes. No significant vascular findings are present. Scattered atherosclerosis in the bilateral iliac vasculature. Reproductive: The uterus and adnexa are unremarkable. Other: No evidence of abdominal wall mass or hernia. Musculoskeletal:  No acute or significant osseous findings. IMPRESSION: *Interval progression of findings suggestive of sigmoid colonic diverticulitis now with a loculated fluid collection, likely pericolonic abscess measuring 3.5 x 1.5 cm with significant surrounding fat stranding changes and peritoneal thickening. Would recommend direct visualization upon resolution of symptoms to exclude underlying pathology given the chronicity of the symptoms. Electronically Signed   By: Jonna Clark M.D.   On: 08/11/2019 21:26    Procedures Procedures (including critical care time)  Medications Ordered in ED Medications  sodium chloride flush (NS) 0.9 % injection 3 mL (has no administration in time range)  acetaminophen (TYLENOL) tablet 650 mg (650 mg Oral Not Given 08/11/19 2132)  cefTRIAXone (ROCEPHIN) 2 g in sodium chloride 0.9 % 100 mL IVPB (2 g Intravenous New Bag/Given 08/11/19 2246)    And  metroNIDAZOLE (FLAGYL) IVPB 500 mg (500 mg Intravenous New Bag/Given 08/11/19 2247)  fentaNYL (SUBLIMAZE) injection 50 mcg (has no administration in time range)  0.9 %  sodium chloride infusion (has no administration in time range)  sodium chloride 0.9 % bolus 1,000 mL (0 mLs Intravenous Stopped 08/11/19 2132)  ondansetron (ZOFRAN) injection 4 mg (4 mg Intravenous Given 08/11/19 2027)  fentaNYL (SUBLIMAZE) injection 25 mcg (25 mcg Intravenous Given 08/11/19 2027)  iohexol (OMNIPAQUE) 300 MG/ML solution 100 mL (100 mLs Intravenous Contrast Given 08/11/19 2050)    ED Course  I have reviewed the triage vital signs and the nursing notes.  Pertinent labs & imaging results that were available during my care of the patient were reviewed by me and considered in my medical decision making (see chart for details).  Clinical Course as of Aug 11 2335  Thu Aug 11, 2019  2336 Spoke with hospitalist who will see and admit patient.   [GG]    Clinical Course User Index [GG] Lorelee New, PA-C   MDM Rules/Calculators/A&P                       Patient reports subjective fevers and chills at home.  She was initially febrile on ED arrival with 101.1 F and tachycardia to 101 BPM, however her vital signs have since spontaneously improved prior to administration of Tylenol 60 mg.  Her laboratory work-up is largely unremarkable.  She has a mild hypokalemia to 3.2, but otherwise no significant abnormalities.  UA demonstrates no evidence of infection.    Given patient reported fevers and chills at home in conjunction with her acute onset LLQ abdominal pain and  tenderness, will obtain CT abdomen and pelvis with contrast.  Discussed risks and benefits of imaging and patient would like to proceed.  Given her history of diverticular disease, would like to exclude complicated diverticulitis such as abscess or perforation.  I personally reviewed CT obtained of abdomen and pelvis which demonstrates progression of her sigmoid colonic diverticulitis that now includes a loculated fluid collection measuring 3.5 x 1.5 cm with surrounding fat stranding and peritoneal thickening concerning for abscess.  Given the chronicity of her symptoms, radiologist recommends direct visualization to exclude malignancy or other underlying pathology.   Patient will likely require IR for drainage in the morning.  Will consult hospitalist for admission.  Starting patient on IV Rocephin and IV Flagyl.  We will also order maintenance fluids.  N.p.o. after midnight.  Spoke with hospitalist who will see and admit patient.  Final Clinical Impression(s) / ED Diagnoses Final diagnoses:  Colonic diverticular abscess    Rx / DC Orders ED Discharge Orders    None       Elvera Maria 08/11/19 2337    Gerhard Munch, MD 08/11/19 2349

## 2019-08-11 NOTE — ED Triage Notes (Signed)
C/o abdominal pain x 6 months; stated her GI doctor ordered a CT scan and find swelling on her intestines. Pt stated her pain today is severe.

## 2019-08-11 NOTE — ED Notes (Signed)
Pt ambulatory to and from BR with no assistance and steady gait.  

## 2019-08-11 NOTE — H&P (Signed)
Molly Anderson CBJ:[id number] DOB: 18-Jan-1966 DOA: 08/11/2019   PCP: Patient, No Pcp Per   Outpatient Specialists:    GI  Dr. Loreta Ave    Patient arrived to ER on 08/11/19 at 1753  Patient coming from: home Lives   With family    Chief Complaint:   Chief Complaint  Patient presents with  . Abdominal Pain    HPI: Molly Anderson is a 54 y.o. female with medical history significant of hypertension, diverticulosis     Presented with   abdominal pain for the past 6 months her GI doctor recently ordered CT scan that showed abnormalities and was told her to present to emergency department she had significant abdominal pain today CT scan apparently showed diverticulitis her pain has been worse for the past 1 day and left lower quadrant she have had some subjective fevers and chills some decreased appetite nausea She have had some of the symptoms on and off for the past 6 months. Her GI doctor Dr. Loreta Ave was planning to do colonoscopy but has not been able to do so secondary to her recurrent flares.  He has been treated at home with ciprofloxacin and Flagyl.  But it has worsened her nausea.  She switched to Augmentin but has been off the ABX for the past 2wks as per rec of her MD.  Patient has been seen in the emergency department for similar complaints in March as well as April.   Infectious risk factors:  Reports  Fever , N/V/ abdominal pain,      Has   NOt been vaccinated against COVID (refuses)  In  ER  COVID TEST  NEGATIVE   Lab Results  Component Value Date   SARSCOV2NAA NEGATIVE 08/11/2019    Regarding pertinent Chronic problems:      HTN on lisinopril       While in ER:  CT showed 3.5X1.5 cm abscess with significant surrounding fat stranding changes and peritoneal thickening.   Hospitalist was called for admission for diverticulitis with intrabdominal abscess  The following Work up has been ordered so far:  Orders Placed This Encounter  Procedures  . Blood culture  (routine x 2)  . SARS Coronavirus 2 by RT PCR (hospital order, performed in [facility] hospital lab) Nasopharyngeal Nasopharyngeal Swab  . CT ABDOMEN PELVIS W CONTRAST  . Lipase, blood  . Comprehensive metabolic panel  . CBC  . Urinalysis, Routine w reflex microscopic  . Lactic acid, plasma  . Diet NPO time specified  . Saline Lock IV, Maintain IV access  . Consult for Unassigned Medical Admission  ALL PATIENTS BEING ADMITTED/HAVING PROCEDURES NEED COVID-19 SCREENING    Following Medications were ordered in ER: Medications  sodium chloride flush (NS) 0.9 % injection 3 mL (has no administration in time range)  acetaminophen (TYLENOL) tablet 650 mg (650 mg Oral Not Given 08/11/19 2132)  cefTRIAXone (ROCEPHIN) 2 g in sodium chloride 0.9 % 100 mL IVPB (2 g Intravenous New Bag/Given 08/11/19 2246)    And  metroNIDAZOLE (FLAGYL) IVPB 500 mg (500 mg Intravenous New Bag/Given 08/11/19 2247)  fentaNYL (SUBLIMAZE) injection 50 mcg (has no administration in time range)  sodium chloride 0.9 % bolus 1,000 mL (0 mLs Intravenous Stopped 08/11/19 2132)  ondansetron (ZOFRAN) injection 4 mg (4 mg Intravenous Given 08/11/19 2027)  fentaNYL (SUBLIMAZE) injection 25 mcg (25 mcg Intravenous Given 08/11/19 2027)  iohexol (OMNIPAQUE) 300 MG/ML solution 100 mL (100 mLs Intravenous Contrast Given 08/11/19 2050)    Significant initial  Findings: Abnormal Labs Reviewed  COMPREHENSIVE METABOLIC PANEL - Abnormal; Notable for the following components:      Result Value   Potassium 3.2 (*)    CO2 21 (*)    Glucose, Bld 103 (*)    Calcium 8.8 (*)    AST 13 (*)    All other components within normal limits     Otherwise labs showing:    Recent Labs  Lab 08/11/19 1830  NA 140  K 3.2*  CO2 21*  GLUCOSE 103*  BUN 6  CREATININE 0.61  CALCIUM 8.8*  Cr  stable,    Lab Results  Component Value Date   CREATININE 0.61 08/11/2019   CREATININE 0.59 06/30/2019   CREATININE 0.71 06/22/2019    Recent Labs  Lab  08/11/19 1830  AST 13*  ALT 13  ALKPHOS 65  BILITOT 0.8  PROT 6.7  ALBUMIN 3.9   Lab Results  Component Value Date   CALCIUM 8.8 (L) 08/11/2019     WBC        Component Value Date/Time   WBC 6.4 08/11/2019 1830   ANC    Component Value Date/Time   NEUTROABS 4.1 06/30/2019 0736   ALC No components found for: LYMPHAB    Plt: Lab Results  Component Value Date   PLT 252 08/11/2019     Lactic Acid, Venous    Component Value Date/Time   LATICACIDVEN 0.9 08/11/2019 2009   HG/HCT  Stable,     Component Value Date/Time   HGB 12.5 08/11/2019 1830   HCT 38.2 08/11/2019 1830    Recent Labs  Lab 08/11/19 1830  LIPASE 17   No results for input(s): AMMONIA in the last 168 hours.  No components found for: LABALBU     ECG: not  Ordered     UA no evidence of UTI     Urine analysis:    Component Value Date/Time   COLORURINE YELLOW 08/11/2019 1929   APPEARANCEUR CLEAR 08/11/2019 1929   LABSPEC 1.011 08/11/2019 1929   PHURINE 6.0 08/11/2019 1929   GLUCOSEU NEGATIVE 08/11/2019 1929   HGBUR NEGATIVE 08/11/2019 1929   BILIRUBINUR NEGATIVE 08/11/2019 1929   KETONESUR NEGATIVE 08/11/2019 1929   PROTEINUR NEGATIVE 08/11/2019 1929   NITRITE NEGATIVE 08/11/2019 1929   LEUKOCYTESUR NEGATIVE 08/11/2019 1929      Ordered   CTabd/pelvis -  fluid collection measuring approximately 3.5 x 1.5 cm      ED Triage Vitals  Enc Vitals Group     BP 08/11/19 1803 (!) 142/99     Pulse Rate 08/11/19 1803 (!) 101     Resp 08/11/19 1803 16     Temp 08/11/19 1803 (!) 101.1 F (38.4 C)     Temp Source 08/11/19 1803 Oral     SpO2 08/11/19 1803 100 %     Weight 08/11/19 1803 133 lb (60.3 kg)     Height 08/11/19 1803 5\' 3"  (1.6 m)     Head Circumference --      Peak Flow --      Pain Score 08/11/19 1806 8     Pain Loc --      Pain Edu? --      Excl. in GC? --   TMAX(24)@       Latest  Blood pressure (!) 147/90, pulse 78, temperature 98.5 F (36.9 C), temperature source  Oral, resp. rate 16, height 5\' 3"  (1.6 m), weight 60.3 kg, SpO2 100 %.    Review of Systems:  Pertinent positives include:  Fevers, chills, abdominal pain, nausea, vomiting,   Constitutional:  No weight loss, night sweats, fatigue, weight loss  HEENT:  No headaches, Difficulty swallowing,Tooth/dental problems,Sore throat,  No sneezing, itching, ear ache, nasal congestion, post nasal drip,  Cardio-vascular:  No chest pain, Orthopnea, PND, anasarca, dizziness, palpitations.no Bilateral lower extremity swelling  GI:  No heartburn, indigestion , diarrhea, change in bowel habits, loss of appetite, melena, blood in stool, hematemesis Resp:  no shortness of breath at rest. No dyspnea on exertion, No excess mucus, no productive cough, No non-productive cough, No coughing up of blood.No change in color of mucus.No wheezing. Skin:  no rash or lesions. No jaundice GU:  no dysuria, change in color of urine, no urgency or frequency. No straining to urinate.  No flank pain.  Musculoskeletal:  No joint pain or no joint swelling. No decreased range of motion. No back pain.  Psych:  No change in mood or affect. No depression or anxiety. No memory loss.  Neuro: no localizing neurological complaints, no tingling, no weakness, no double vision, no gait abnormality, no slurred speech, no confusion  All systems reviewed and apart from [place] all are negative  Past Medical History:   Past Medical History:  Diagnosis Date  . Hypertension   . MI (myocardial infarction) ([place])   . Thyroid disease       Past Surgical History:  Procedure Laterality Date  . ECTOPIC PREGNANCY SURGERY Left     Social History:  Ambulatory   independently       reports that she has quit smoking. She has never used smokeless tobacco. She reports that she does not drink alcohol or use drugs.   Family History:   Family History  Problem Relation Age of Onset  . Diabetes Neg Hx   . Hypertension Neg Hx      Allergies: Allergies  Allergen Reactions  . Morphine And Related     Hives around IV site      Prior to Admission medications   Medication Sig Start Date End Date Taking? Authorizing Provider  amoxicillin-clavulanate (AUGMENTIN) 875-125 MG tablet Take 1 tablet by mouth 2 (two) times daily. One po bid x 7 days 06/23/19   Ripley Fraise, MD  dicyclomine (BENTYL) 20 MG tablet Take 1 tablet (20 mg total) by mouth 2 (two) times daily. 06/30/19   Khatri, Hina, PA-C  HYDROcodone-acetaminophen (NORCO/VICODIN) 5-325 MG tablet Take 1 tablet by mouth every 4 (four) hours as needed. 06/30/19   Maudie Flakes, MD  lisinopril (ZESTRIL) 20 MG tablet Take 20 mg by mouth every morning. 05/06/19   [provider]  ondansetron (ZOFRAN ODT) 4 MG disintegrating tablet Take 1 tablet (4 mg total) by mouth every 8 (eight) hours as needed for nausea or vomiting. 06/30/19   Khatri, Hina, PA-C  sertraline (ZOLOFT) 25 MG tablet Take 25 mg by mouth daily. 05/06/19   [provider]   Physical Exam: Blood pressure (!) 147/90, pulse 78, temperature 98.5 F (36.9 C), temperature source Oral, resp. rate 16, height 5\' 3"  (1.6 m), weight 60.3 kg, SpO2 100 %. 1. General:  in No  Acute distress    Chronically ill -appearing 2. Psychological: Alert and   Oriented 3. Head/ENT:    Dry Mucous Membranes                          Head Non traumatic, neck supple  Normal   Dentition 4. SKIN: normal   Skin turgor,  Skin clean Dry and intact no rash 5. Heart: Regular rate and rhythm no  Murmur, no Rub or gallop 6. Lungs:  no wheezes or crackles   7. Abdomen: Soft, LLQ tender, Non distended  obese  bowel sounds present 8. Lower extremities: no clubbing, cyanosis, no  edema 9. Neurologically Grossly intact, moving all 4 extremities equally  10. MSK: Normal range of motion   All other LABS:     Recent Labs  Lab 08/11/19 1830  WBC 6.4  HGB 12.5  HCT 38.2  MCV 87.8  PLT 252      Recent Labs  Lab 08/11/19 1830  NA 140  K 3.2*  CL 104  CO2 21*  GLUCOSE 103*  BUN 6  CREATININE 0.61  CALCIUM 8.8*     Recent Labs  Lab 08/11/19 1830  AST 13*  ALT 13  ALKPHOS 65  BILITOT 0.8  PROT 6.7  ALBUMIN 3.9       Cultures: No results found for: SDES, SPECREQUEST, CULT, REPTSTATUS   Radiological Exams on Admission: CT ABDOMEN PELVIS W CONTRAST  Result Date: 08/11/2019 CLINICAL DATA:  Abdominal pain for 6 months question of diverticulitis. EXAM: CT ABDOMEN AND PELVIS WITH CONTRAST TECHNIQUE: Multidetector CT imaging of the abdomen and pelvis was performed using the standard protocol following bolus administration of intravenous contrast. CONTRAST:  OMNIPAQUE IOHEXOL 300 MG/ML  SOLN COMPARISON:  Most recent prior June 23, 2019 FINDINGS: Lower chest: The visualized heart size within normal limits. No pericardial fluid/thickening. No hiatal hernia. The visualized portions of the lungs are clear. Hepatobiliary: The liver is normal in density without focal abnormality.The main portal vein is patent. No evidence of calcified gallstones, gallbladder wall thickening or biliary dilatation. Pancreas: Unremarkable. No pancreatic ductal dilatation or surrounding inflammatory changes. Spleen: Normal in size without focal abnormality. Adrenals/Urinary Tract: Both adrenal glands appear normal. The kidneys and collecting system appear normal without evidence of urinary tract calculus or hydronephrosis. Bladder is unremarkable. Stomach/Bowel: The stomach and small bowel are normal in appearance. The appendix is unremarkable. Again noted is a long segment of sigmoid colon with diffuse wall thickening surrounding colonic diverticula. And adjacent mesenteric fat stranding changes with peritoneal thickening. There does however appear to be a new adjacent posterior multilocular fluid collection measuring approximately 3.5 x 1.5 cm, series 3, image 64, likely pericolonic abscess.  Vascular/Lymphatic: There are no enlarged mesenteric, retroperitoneal, or pelvic lymph nodes. No significant vascular findings are present. Scattered atherosclerosis in the bilateral iliac vasculature. Reproductive: The uterus and adnexa are unremarkable. Other: No evidence of abdominal wall mass or hernia. Musculoskeletal: No acute or significant osseous findings. IMPRESSION: *Interval progression of findings suggestive of sigmoid colonic diverticulitis now with a loculated fluid collection, likely pericolonic abscess measuring 3.5 x 1.5 cm with significant surrounding fat stranding changes and peritoneal thickening. Would recommend direct visualization upon resolution of symptoms to exclude underlying pathology given the chronicity of the symptoms. Electronically Signed   By: Jonna Clark M.D.   On: 08/11/2019 21:26    Chart has been reviewed   Assessment/Plan  54 y.o. female with medical history significant of hypertension, diverticulosis     Admitted for diverticulitis abdominal abscess  Present on Admission: . Diverticulitis - continue IV antibiotics rocephin and flagyl, bowel rest, if no improvement may need surgical consult  . Intra-abdominal abscess (HCC) - continue IV antibiotics, appreciate IR consult for possible drain placement  . Hypokalemia - -  will replace and repeat in AM,  check magnesium level and replace as needed   . Essential hypertension - hold lisinopril for tonight, in case pt decompensates. Resume when able   Other plan as per orders.  DVT prophylaxis:  SCD      Code Status:  FULL CODE   as per patient  I had personally discussed CODE STATUS with patient    Family Communication:   Family not at  Bedside    Disposition Plan:    To home once workup is complete and patient is stable   Following barriers for discharge:                            Electrolytes corrected                             Pain controlled with PO medications                                Afebrile,  able to transition to PO antibiotics                             Will need to be able to tolerate PO                                                    Will need consultants to evaluate patient prior to discharge                        Consults called: none, will email Dr. Loreta Ave that pt has been admitted, please consult in AM,  If no improvement may need surgery consult Will order IR consult  Admission status:  ED Disposition    ED Disposition Condition Comment   Admit  Hospital Area: [facility] [[id number]]  Level of Care: Telemetry Medical [[id number]]  May admit patient to [facility] or [facility] if equivalent level of care is available:: No  Covid Evaluation: Confirmed COVID Negative  Diagnosis: Diverticulitis [161096]  Admitting Physician: Therisa Doyne [[id number]]  Attending Physician: Therisa Doyne [[id number]]  Estimated length of stay: past midnight tomorrow  Certification:: I certify this patient will need inpatient services for at least 2 midnights        inpatient     I Expect 2 midnight stay secondary to severity of patient's current illness need for inpatient interventions justified by the following:  Severe lab/radiological/exam abnormalities including:    intraabdominal abscess . Failure of outpatient treatment  That are currently affecting medical management.   I expect  patient to be hospitalized for 2 midnights requiring inpatient medical care.  Patient is at high risk for adverse outcome (such as loss of life or disability) if not treated.  Indication for inpatient stay as follows:    severe pain requiring acute inpatient management,  inability to maintain oral hydration    Need for operative/procedural  intervention   Need for IV antibiotics, IV fluids,  IV pain medications     Level of care    tele  For   24H      Precautions: admitted as  Covid Negative     PPE: Used by the provider:   P100  eye Goggles,  Gloves     Keilan Nichol 08/12/2019, 12:56 AM    [facility]     after 2 AM please page floor coverage PA If 7AM-7PM, please contact the day team taking care of the patient using [email or web address]   Patient was evaluated in the context of the global COVID-19 pandemic, which necessitated consideration that the patient might be at risk for infection with the SARS-CoV-2 virus that causes COVID-19. Institutional protocols and algorithms that pertain to the evaluation of patients at risk for COVID-19 are in a state of rapid change based on information released by regulatory bodies including the CDC and federal and state organizations. These policies and algorithms were followed during the patient's care.

## 2019-08-12 ENCOUNTER — Encounter (HOSPITAL_COMMUNITY): Payer: Self-pay | Admitting: Internal Medicine

## 2019-08-12 DIAGNOSIS — Z87891 Personal history of nicotine dependence: Secondary | ICD-10-CM | POA: Diagnosis not present

## 2019-08-12 DIAGNOSIS — K651 Peritoneal abscess: Secondary | ICD-10-CM | POA: Diagnosis not present

## 2019-08-12 DIAGNOSIS — K5720 Diverticulitis of large intestine with perforation and abscess without bleeding: Secondary | ICD-10-CM

## 2019-08-12 DIAGNOSIS — Z885 Allergy status to narcotic agent status: Secondary | ICD-10-CM | POA: Diagnosis not present

## 2019-08-12 DIAGNOSIS — Z20822 Contact with and (suspected) exposure to covid-19: Secondary | ICD-10-CM | POA: Diagnosis present

## 2019-08-12 DIAGNOSIS — I10 Essential (primary) hypertension: Secondary | ICD-10-CM | POA: Diagnosis present

## 2019-08-12 DIAGNOSIS — K5792 Diverticulitis of intestine, part unspecified, without perforation or abscess without bleeding: Secondary | ICD-10-CM | POA: Diagnosis not present

## 2019-08-12 DIAGNOSIS — K5900 Constipation, unspecified: Secondary | ICD-10-CM | POA: Diagnosis present

## 2019-08-12 DIAGNOSIS — R1032 Left lower quadrant pain: Secondary | ICD-10-CM | POA: Diagnosis present

## 2019-08-12 DIAGNOSIS — E876 Hypokalemia: Secondary | ICD-10-CM | POA: Diagnosis present

## 2019-08-12 DIAGNOSIS — I252 Old myocardial infarction: Secondary | ICD-10-CM | POA: Diagnosis not present

## 2019-08-12 DIAGNOSIS — Z79899 Other long term (current) drug therapy: Secondary | ICD-10-CM | POA: Diagnosis not present

## 2019-08-12 LAB — COMPREHENSIVE METABOLIC PANEL
ALT: 10 U/L (ref 0–44)
AST: 10 U/L — ABNORMAL LOW (ref 15–41)
Albumin: 3.1 g/dL — ABNORMAL LOW (ref 3.5–5.0)
Alkaline Phosphatase: 54 U/L (ref 38–126)
Anion gap: 10 (ref 5–15)
BUN: 5 mg/dL — ABNORMAL LOW (ref 6–20)
CO2: 24 mmol/L (ref 22–32)
Calcium: 8.1 mg/dL — ABNORMAL LOW (ref 8.9–10.3)
Chloride: 106 mmol/L (ref 98–111)
Creatinine, Ser: 0.72 mg/dL (ref 0.44–1.00)
GFR calc Af Amer: >60 mL/min (ref >60)
GFR calc non Af Amer: >60 mL/min (ref >60)
Glucose, Bld: 107 mg/dL — ABNORMAL HIGH (ref 70–99)
Potassium: 3.6 mmol/L (ref 3.5–5.1)
Sodium: 140 mmol/L (ref 135–145)
Total Bilirubin: 0.7 mg/dL (ref 0.3–1.2)
Total Protein: 5.7 g/dL — ABNORMAL LOW (ref 6.5–8.1)

## 2019-08-12 LAB — MAGNESIUM: Magnesium: 1.6 mg/dL — ABNORMAL LOW (ref 1.7–2.4)

## 2019-08-12 LAB — CBC WITH DIFFERENTIAL/PLATELET
Abs Immature Granulocytes: 0.02 10*3/uL (ref 0.00–0.07)
Basophils Absolute: 0 10*3/uL (ref 0.0–0.1)
Basophils Relative: 1 %
Eosinophils Absolute: 0 10*3/uL (ref 0.0–0.5)
Eosinophils Relative: 0 %
HCT: 33.6 % — ABNORMAL LOW (ref 36.0–46.0)
Hemoglobin: 11.2 g/dL — ABNORMAL LOW (ref 12.0–15.0)
Immature Granulocytes: 0 %
Lymphocytes Relative: 16 %
Lymphs Abs: 1 10*3/uL (ref 0.7–4.0)
MCH: 29.3 pg (ref 26.0–34.0)
MCHC: 33.3 g/dL (ref 30.0–36.0)
MCV: 88 fL (ref 80.0–100.0)
Monocytes Absolute: 0.7 10*3/uL (ref 0.1–1.0)
Monocytes Relative: 12 %
Neutro Abs: 4.6 10*3/uL (ref 1.7–7.7)
Neutrophils Relative %: 71 %
Platelets: 228 10*3/uL (ref 150–400)
RBC: 3.82 MIL/uL — ABNORMAL LOW (ref 3.87–5.11)
RDW: 13.2 % (ref 11.5–15.5)
WBC: 6.5 10*3/uL (ref 4.0–10.5)
nRBC: 0 % (ref 0.0–0.2)

## 2019-08-12 LAB — PHOSPHORUS: Phosphorus: 2.5 mg/dL (ref 2.5–4.6)

## 2019-08-12 LAB — HIV ANTIBODY (ROUTINE TESTING W REFLEX): HIV Screen 4th Generation wRfx: NONREACTIVE

## 2019-08-12 LAB — TSH: TSH: 0.316 u[IU]/mL — ABNORMAL LOW (ref 0.350–4.500)

## 2019-08-12 MED ORDER — ACETAMINOPHEN 325 MG PO TABS: 650.0000 mg | ORAL_TABLET | Freq: Four times a day (QID) | ORAL | Status: DC | PRN

## 2019-08-12 MED ORDER — SODIUM CHLORIDE 0.9 % IV SOLN: 2.0000 g | Freq: Once | INTRAVENOUS | Status: DC

## 2019-08-12 MED ORDER — ACETAMINOPHEN 650 MG RE SUPP: 650.0000 mg | Freq: Four times a day (QID) | RECTAL | Status: DC | PRN

## 2019-08-12 MED ORDER — ONDANSETRON HCL 4 MG/2ML IJ SOLN
4.0000 mg | Freq: Four times a day (QID) | INTRAMUSCULAR | Status: DC | PRN
  Administered 2019-08-12 – 2019-08-15 (×4): 4 mg via INTRAVENOUS
  Filled 2019-08-12 (×4): qty 2

## 2019-08-12 MED ORDER — SODIUM CHLORIDE 0.9% FLUSH
3.0000 mL | Freq: Two times a day (BID) | INTRAVENOUS | Status: DC
  Administered 2019-08-12 – 2019-08-17 (×7): 3 mL via INTRAVENOUS

## 2019-08-12 MED ORDER — MAGNESIUM SULFATE 4 GM/100ML IV SOLN
4.0000 g | Freq: Once | INTRAVENOUS | Status: AC
  Administered 2019-08-12: 4 g via INTRAVENOUS
  Filled 2019-08-12: qty 100

## 2019-08-12 MED ORDER — HYDROCODONE-ACETAMINOPHEN 5-325 MG PO TABS
1.0000 | ORAL_TABLET | ORAL | Status: DC | PRN
  Administered 2019-08-12 – 2019-08-17 (×19): 2 via ORAL
  Filled 2019-08-12 (×19): qty 2

## 2019-08-12 MED ORDER — METRONIDAZOLE IN NACL 5-0.79 MG/ML-% IV SOLN: 500.0000 mg | Freq: Once | INTRAVENOUS | Status: DC

## 2019-08-12 MED ORDER — SODIUM CHLORIDE 0.9 % IV SOLN: INTRAVENOUS | Status: DC

## 2019-08-12 MED ORDER — FENTANYL CITRATE (PF) 100 MCG/2ML IJ SOLN: 25.0000 ug | INTRAMUSCULAR | Status: DC | PRN

## 2019-08-12 MED ORDER — POTASSIUM CHLORIDE CRYS ER 20 MEQ PO TBCR
40.0000 meq | EXTENDED_RELEASE_TABLET | Freq: Once | ORAL | Status: AC
  Administered 2019-08-12: 40 meq via ORAL
  Filled 2019-08-12: qty 2

## 2019-08-12 MED ORDER — SERTRALINE HCL 50 MG PO TABS
25.0000 mg | ORAL_TABLET | Freq: Every day | ORAL | Status: DC
  Administered 2019-08-12 – 2019-08-17 (×6): 25 mg via ORAL
  Filled 2019-08-12 (×6): qty 1

## 2019-08-12 MED ORDER — POTASSIUM CHLORIDE 10 MEQ/100ML IV SOLN
10.0000 meq | INTRAVENOUS | Status: AC
  Administered 2019-08-12 (×4): 10 meq via INTRAVENOUS
  Filled 2019-08-12 (×4): qty 100

## 2019-08-12 MED ORDER — SODIUM CHLORIDE 0.9 % IV SOLN
2.0000 g | Freq: Two times a day (BID) | INTRAVENOUS | Status: DC
  Filled 2019-08-12: qty 20

## 2019-08-12 MED ORDER — PIPERACILLIN-TAZOBACTAM 3.375 G IVPB
3.3750 g | Freq: Three times a day (TID) | INTRAVENOUS | Status: DC
  Administered 2019-08-12 – 2019-08-17 (×15): 3.375 g via INTRAVENOUS
  Filled 2019-08-12 (×15): qty 50

## 2019-08-12 MED ORDER — ONDANSETRON HCL 4 MG PO TABS
4.0000 mg | ORAL_TABLET | Freq: Four times a day (QID) | ORAL | Status: DC | PRN
  Administered 2019-08-12: 4 mg via ORAL
  Filled 2019-08-12: qty 1

## 2019-08-12 MED ORDER — METRONIDAZOLE IN NACL 5-0.79 MG/ML-% IV SOLN
500.0000 mg | Freq: Three times a day (TID) | INTRAVENOUS | Status: DC
  Administered 2019-08-12: 500 mg via INTRAVENOUS
  Filled 2019-08-12: qty 100

## 2019-08-12 NOTE — Plan of Care (Signed)

## 2019-08-12 NOTE — Consult Note (Signed)
Reason for Consult: Complicated diverticulitis Referring Physician: [facility]  Webb Silversmith HPI: This is a 54 year old femald with a PMH of HTN and MI admitted for diverticulitis with an abscess.  The patient reports suffering with LLQ pain since February/March this year.  She underwent two CT scans of the abdomen in March with findings of a thickened sigmoid colon with mild fat reticulation and regional peritoneal thickening.  Her CT scan last evening showed a diverticulitis with an abscess.  She presented to the hospital after her symptoms worsened.  She was initially treated with Cipro and Flagyl by Dr. Loreta Ave as an outpatient, but she was not able to tolerate the Flagyl.  She changed over to Augmentin with very good results.  The pain resolved and she was feeling well for approximately 1-2 weeks.  This is when her pain recurred.  Many years ago she had a colonoscopy, but she cannot recall the reason or the findings for the procedure.  June 2nd is her current colonoscopy date with Dr. Loreta Ave.  Past Medical History:  Diagnosis Date  . Hypertension   . MI (myocardial infarction) (HCC)   . Thyroid disease     Past Surgical History:  Procedure Laterality Date  . ECTOPIC PREGNANCY SURGERY Left     Family History  Problem Relation Age of Onset  . Diabetes Neg Hx   . Hypertension Neg Hx     Social History:  reports that she has quit smoking. She has never used smokeless tobacco. She reports that she does not drink alcohol or use drugs.  Allergies:  Allergies  Allergen Reactions  . Morphine And Related     Hives around IV site    Medications:  Scheduled: . acetaminophen  650 mg Oral Once  . sodium chloride flush  3 mL Intravenous Q12H   Continuous: . sodium chloride    . magnesium sulfate bolus IVPB 4 g (08/12/19 1101)  . metronidazole Stopped (08/12/19 0959)  . piperacillin-tazobactam (ZOSYN)  IV      Results for orders placed or performed during the hospital encounter of  08/11/19 (from the past 24 hour(s))  Lipase, blood     Status: None   Collection Time: 08/11/19  6:30 PM  Result Value Ref Range   Lipase 17 11 - 51 U/L  Comprehensive metabolic panel     Status: Abnormal   Collection Time: 08/11/19  6:30 PM  Result Value Ref Range   Sodium 140 135 - 145 mmol/L   Potassium 3.2 (L) 3.5 - 5.1 mmol/L   Chloride 104 98 - 111 mmol/L   CO2 21 (L) 22 - 32 mmol/L   Glucose, Bld 103 (H) 70 - 99 mg/dL   BUN 6 6 - 20 mg/dL   Creatinine, Ser 4.09 0.44 - 1.00 mg/dL   Calcium 8.8 (L) 8.9 - 10.3 mg/dL   Total Protein 6.7 6.5 - 8.1 g/dL   Albumin 3.9 3.5 - 5.0 g/dL   AST 13 (L) 15 - 41 U/L   ALT 13 0 - 44 U/L   Alkaline Phosphatase 65 38 - 126 U/L   Total Bilirubin 0.8 0.3 - 1.2 mg/dL   GFR calc non Af Amer >60 >60 mL/min   GFR calc Af Amer >60 >60 mL/min   Anion gap 15 5 - 15  CBC     Status: None   Collection Time: 08/11/19  6:30 PM  Result Value Ref Range   WBC 6.4 4.0 - 10.5 K/uL   RBC  4.35 3.87 - 5.11 MIL/uL   Hemoglobin 12.5 12.0 - 15.0 g/dL   HCT 38.2 36.0 - 46.0 %   MCV 87.8 80.0 - 100.0 fL   MCH 28.7 26.0 - 34.0 pg   MCHC 32.7 30.0 - 36.0 g/dL   RDW 13.0 11.5 - 15.5 %   Platelets 252 150 - 400 K/uL   nRBC 0.0 0.0 - 0.2 %  Lactic acid, plasma     Status: None   Collection Time: 08/11/19  6:30 PM  Result Value Ref Range   Lactic Acid, Venous 0.7 0.5 - 1.9 mmol/L  Urinalysis, Routine w reflex microscopic     Status: None   Collection Time: 08/11/19  7:29 PM  Result Value Ref Range   Color, Urine YELLOW YELLOW   APPearance CLEAR CLEAR   Specific Gravity, Urine 1.011 1.005 - 1.030   pH 6.0 5.0 - 8.0   Glucose, UA NEGATIVE NEGATIVE mg/dL   Hgb urine dipstick NEGATIVE NEGATIVE   Bilirubin Urine NEGATIVE NEGATIVE   Ketones, ur NEGATIVE NEGATIVE mg/dL   Protein, ur NEGATIVE NEGATIVE mg/dL   Nitrite NEGATIVE NEGATIVE   Leukocytes,Ua NEGATIVE NEGATIVE  Lactic acid, plasma     Status: None   Collection Time: 08/11/19  8:09 PM  Result Value Ref  Range   Lactic Acid, Venous 0.9 0.5 - 1.9 mmol/L  SARS Coronavirus 2 by RT PCR (hospital order, performed in [place] hospital lab) Nasopharyngeal Nasopharyngeal Swab     Status: None   Collection Time: 08/11/19  9:48 PM   Specimen: Nasopharyngeal Swab  Result Value Ref Range   SARS Coronavirus 2 NEGATIVE NEGATIVE  HIV Antibody (routine testing w rflx)     Status: None   Collection Time: 08/12/19  5:02 AM  Result Value Ref Range   HIV Screen 4th Generation wRfx Non Reactive Non Reactive  Magnesium     Status: Abnormal   Collection Time: 08/12/19  5:02 AM  Result Value Ref Range   Magnesium 1.6 (L) 1.7 - 2.4 mg/dL  Phosphorus     Status: None   Collection Time: 08/12/19  5:02 AM  Result Value Ref Range   Phosphorus 2.5 2.5 - 4.6 mg/dL  CBC WITH DIFFERENTIAL     Status: Abnormal   Collection Time: 08/12/19  5:02 AM  Result Value Ref Range   WBC 6.5 4.0 - 10.5 K/uL   RBC 3.82 (L) 3.87 - 5.11 MIL/uL   Hemoglobin 11.2 (L) 12.0 - 15.0 g/dL   HCT 33.6 (L) 36.0 - 46.0 %   MCV 88.0 80.0 - 100.0 fL   MCH 29.3 26.0 - 34.0 pg   MCHC 33.3 30.0 - 36.0 g/dL   RDW 13.2 11.5 - 15.5 %   Platelets 228 150 - 400 K/uL   nRBC 0.0 0.0 - 0.2 %   Neutrophils Relative % 71 %   Neutro Abs 4.6 1.7 - 7.7 K/uL   Lymphocytes Relative 16 %   Lymphs Abs 1.0 0.7 - 4.0 K/uL   Monocytes Relative 12 %   Monocytes Absolute 0.7 0.1 - 1.0 K/uL   Eosinophils Relative 0 %   Eosinophils Absolute 0.0 0.0 - 0.5 K/uL   Basophils Relative 1 %   Basophils Absolute 0.0 0.0 - 0.1 K/uL   Immature Granulocytes 0 %   Abs Immature Granulocytes 0.02 0.00 - 0.07 K/uL  TSH     Status: Abnormal   Collection Time: 08/12/19  5:02 AM  Result Value Ref Range   TSH 0.316 (  L) 0.350 - 4.500 uIU/mL  Comprehensive metabolic panel     Status: Abnormal   Collection Time: 08/12/19  5:02 AM  Result Value Ref Range   Sodium 140 135 - 145 mmol/L   Potassium 3.6 3.5 - 5.1 mmol/L   Chloride 106 98 - 111 mmol/L   CO2 24 22 - 32 mmol/L    Glucose, Bld 107 (H) 70 - 99 mg/dL   BUN 5 (L) 6 - 20 mg/dL   Creatinine, Ser 6.30 0.44 - 1.00 mg/dL   Calcium 8.1 (L) 8.9 - 10.3 mg/dL   Total Protein 5.7 (L) 6.5 - 8.1 g/dL   Albumin 3.1 (L) 3.5 - 5.0 g/dL   AST 10 (L) 15 - 41 U/L   ALT 10 0 - 44 U/L   Alkaline Phosphatase 54 38 - 126 U/L   Total Bilirubin 0.7 0.3 - 1.2 mg/dL   GFR calc non Af Amer >60 >60 mL/min   GFR calc Af Amer >60 >60 mL/min   Anion gap 10 5 - 15     CT ABDOMEN PELVIS W CONTRAST  Result Date: 08/11/2019 CLINICAL DATA:  Abdominal pain for 6 months question of diverticulitis. EXAM: CT ABDOMEN AND PELVIS WITH CONTRAST TECHNIQUE: Multidetector CT imaging of the abdomen and pelvis was performed using the standard protocol following bolus administration of intravenous contrast. CONTRAST:  OMNIPAQUE IOHEXOL 300 MG/ML  SOLN COMPARISON:  Most recent prior June 23, 2019 FINDINGS: Lower chest: The visualized heart size within normal limits. No pericardial fluid/thickening. No hiatal hernia. The visualized portions of the lungs are clear. Hepatobiliary: The liver is normal in density without focal abnormality.The main portal vein is patent. No evidence of calcified gallstones, gallbladder wall thickening or biliary dilatation. Pancreas: Unremarkable. No pancreatic ductal dilatation or surrounding inflammatory changes. Spleen: Normal in size without focal abnormality. Adrenals/Urinary Tract: Both adrenal glands appear normal. The kidneys and collecting system appear normal without evidence of urinary tract calculus or hydronephrosis. Bladder is unremarkable. Stomach/Bowel: The stomach and small bowel are normal in appearance. The appendix is unremarkable. Again noted is a long segment of sigmoid colon with diffuse wall thickening surrounding colonic diverticula. And adjacent mesenteric fat stranding changes with peritoneal thickening. There does however appear to be a new adjacent posterior multilocular fluid collection  measuring approximately 3.5 x 1.5 cm, series 3, image 64, likely pericolonic abscess. Vascular/Lymphatic: There are no enlarged mesenteric, retroperitoneal, or pelvic lymph nodes. No significant vascular findings are present. Scattered atherosclerosis in the bilateral iliac vasculature. Reproductive: The uterus and adnexa are unremarkable. Other: No evidence of abdominal wall mass or hernia. Musculoskeletal: No acute or significant osseous findings. IMPRESSION: *Interval progression of findings suggestive of sigmoid colonic diverticulitis now with a loculated fluid collection, likely pericolonic abscess measuring 3.5 x 1.5 cm with significant surrounding fat stranding changes and peritoneal thickening. Would recommend direct visualization upon resolution of symptoms to exclude underlying pathology given the chronicity of the symptoms. Electronically Signed   By: Jonna Clark M.D.   On: 08/11/2019 21:26    ROS:  As stated above in the HPI otherwise negative.  Blood pressure 111/73, pulse 73, temperature 98.4 F (36.9 C), temperature source Oral, resp. rate 11, height 5\' 3"  (1.6 m), weight 60.3 kg, SpO2 100 %.    PE: Gen: NAD, Alert and Oriented HEENT:  [place]/AT, EOMI Neck: Supple, no LAD Lungs: CTA Bilaterally CV: RRR without M/G/R ABD: Soft, very tender in the LLQ, +BS Ext: No C/C/E  Assessment/Plan: 1) Acute diverticulitis with  an abscess. 2) LLQ abdominal pain.   The patient is not a candidate for percutaneous drainage, per IR.  However, she should be able to be treated with antibiotics alone.  She is not able to tolerate metronidazole and her antibiotics will be changed over to Zosyn.  Hopefully her symptoms will improve/resolve quickly and then she can be transitioned over to Augmentin upon discharge.  Total treatment time should be two weeks.  If she has a rapid recurrence of her diverticulitis in the near future, a surgical consultation will be beneficial.  The CT scan findings suggest that  she may be more prone to recurrence.  Plan: 1) D/C CTX and metronidazole. 2) Zosyn 3.375 g q8 hours. 3) Clear liquid diet. 4) Outpatient colonoscopy will be postponed.  Frona Yost D 08/12/2019, 11:41 AM

## 2019-08-12 NOTE — Progress Notes (Signed)
PROGRESS NOTE    Molly Anderson  JHE:[id number] DOB: 1965-11-07 DOA: 08/11/2019 PCP: Patient, No Pcp Per    Chief Complaint  Patient presents with  . Abdominal Pain    Brief Narrative: Patient is a 54 year old female with history of hypertension, MI, thyroid disease presented with left lower quadrant abdominal pain ongoing for the past 6 months, which has been intermittent was initially treated by her gastroenterologist with Cipro Flagyl as an outpatient however unable to tolerate Flagyl and patient subsequently changed over to Augmentin.  Patient's pain resolved however has been feeling unwell for the past 1 to 2 weeks.  Patient with worsening abdominal pain over the past day with recurrence of her pain.  Patient was scheduled for outpatient colonoscopy this year.  CT abdomen and pelvis done on admission concerning for acute diverticulitis with abscess formation.  Patient placed empirically on IV Rocephin and IV Flagyl.  Patient admitted.  GI consulted.   Assessment & Plan:   Principal Problem:   Diverticulitis Active Problems:   Intra-abdominal abscess ([place])   Hypokalemia   Essential hypertension  1 acute diverticulitis with abscess Patient still with significant lower abdominal and left lower quadrant pain.  No further emesis.  Patient noted to have a temp of 101.1.  Blood cultures pending.  CT abdomen and pelvis was reviewed by IR who did not feel fluid collection was approachable via percutaneous drainage.  Patient was on IV Rocephin and IV Flagyl.  GI consulted and patient seen in consultation by Dr.Hung we will switch antibiotics around to IV Zosyn ID is noted that patient unable to tolerate Flagyl in the past.  Patient started on clear liquids.  Per GI patient symptoms improve/resolve quickly could be transitioned to oral Augmentin on discharge.  Per GI recurrent diverticulitis will likely need surgical evaluation.  Supportive care.  GI following and appreciate input and  recommendations.  2.  Hypokalemia Repleted.  Potassium at 3.6.  3.  Hypomagnesemia Magnesium sulfate 4 g IV x1.  4.  Hypertension Blood pressure noted to be borderline early on this morning.  Hold antihypertensive medications could likely resume home regimen of antihypertensive medications tomorrow if blood pressure improves.    DVT prophylaxis: SCDs Code Status: Full Family Communication: Updated patient.  No family at bedside. Disposition:   Status is: Inpatient    Dispo: The patient is from: Home              Anticipated d/c is to: Home              Anticipated d/c date is: 08/15/2019              Patient currently on IV antibiotics, being treated for acute diverticulitis with abscess.        Consultants:   GI: Dr. Benson Norway 08/12/2019  Procedures:  CT abdomen and pelvis 08/11/2019  Antimicrobials:   IV Zosyn 08/12/2019  IV Rocephin 5/13/ 2021x1 dose  IV Flagyl 5/13/ 2021x1 dose   Subjective: Patient states feeling slightly better than she did on admission.  Still with significant abdominal pain however states slightly better than on admission.  No chest pain.  No shortness of breath.  Objective: Vitals:   08/12/19 1545 08/12/19 1600 08/12/19 1622 08/12/19 1633  BP: 120/83 (!) 130/91  (!) 148/92  Pulse: 63 67  (!) 59  Resp: 19 17  18   Temp:   97.7 F (36.5 C) 98.5 F (36.9 C)  TempSrc:   Axillary Oral  SpO2: 97% 97%  100%  Weight:      Height:        Intake/Output Summary (Last 24 hours) at 08/12/2019 1803 Last data filed at 08/12/2019 1600 Gross per 24 hour  Intake 1225 ml  Output --  Net 1225 ml   Filed Weights   08/11/19 1803  Weight: 60.3 kg    Examination:  General exam: Appears calm and comfortable  Respiratory system: Clear to auscultation. Respiratory effort normal. Cardiovascular system: S1 & S2 heard, RRR. No JVD, murmurs, rubs, gallops or clicks. No pedal edema. Gastrointestinal system: Abdomen is soft, nondistended, tenderness to  palpation in the mid and left lower quadrants, positive bowel sounds.  No rebound.  Some guarding.  Central nervous system: Alert and oriented. No focal neurological deficits. Extremities: Symmetric 5 x 5 power. Skin: No rashes, lesions or ulcers Psychiatry: Judgement and insight appear normal. Mood & affect appropriate.     Data Reviewed: I have personally reviewed following labs and imaging studies  CBC: Recent Labs  Lab 08/11/19 1830 08/12/19 0502  WBC 6.4 6.5  NEUTROABS  --  4.6  HGB 12.5 11.2*  HCT 38.2 33.6*  MCV 87.8 88.0  PLT 252 228    Basic Metabolic Panel: Recent Labs  Lab 08/11/19 1830 08/12/19 0502  NA 140 140  K 3.2* 3.6  CL 104 106  CO2 21* 24  GLUCOSE 103* 107*  BUN 6 5*  CREATININE 0.61 0.72  CALCIUM 8.8* 8.1*  MG  --  1.6*  PHOS  --  2.5    GFR: Estimated Creatinine Clearance: 66.5 mL/min (by C-G formula based on SCr of 0.72 mg/dL).  Liver Function Tests: Recent Labs  Lab 08/11/19 1830 08/12/19 0502  AST 13* 10*  ALT 13 10  ALKPHOS 65 54  BILITOT 0.8 0.7  PROT 6.7 5.7*  ALBUMIN 3.9 3.1*    CBG: No results for input(s): GLUCAP in the last 168 hours.   Recent Results (from the past 240 hour(s))  Blood culture (routine x 2)     Status: None (Preliminary result)   Collection Time: 08/11/19  2:51 PM   Specimen: BLOOD LEFT WRIST  Result Value Ref Range Status   Specimen Description BLOOD LEFT WRIST  Final   Special Requests   Final    BOTTLES DRAWN AEROBIC AND ANAEROBIC Blood Culture adequate volume   Culture   Final    NO GROWTH < 24 HOURS Performed at [facility], [address], [place], [place] [address]    Report Status PENDING  Incomplete  Blood culture (routine x 2)     Status: None (Preliminary result)   Collection Time: 08/11/19  6:27 PM   Specimen: BLOOD LEFT HAND  Result Value Ref Range Status   Specimen Description BLOOD LEFT HAND  Final   Special Requests   Final    BOTTLES DRAWN AEROBIC AND ANAEROBIC Blood  Culture results may not be optimal due to an inadequate volume of blood received in culture bottles   Culture   Final    NO GROWTH < 24 HOURS Performed at [facility], [address], [place], [place] [address]    Report Status PENDING  Incomplete  SARS Coronavirus 2 by RT PCR (hospital order, performed in [facility] hospital lab) Nasopharyngeal Nasopharyngeal Swab     Status: None   Collection Time: 08/11/19  9:48 PM   Specimen: Nasopharyngeal Swab  Result Value Ref Range Status   SARS Coronavirus 2 NEGATIVE NEGATIVE Final  Comment: (NOTE) SARS-CoV-2 target nucleic acids are NOT DETECTED. The SARS-CoV-2 RNA is generally detectable in upper and lower respiratory specimens during the acute phase of infection. The lowest concentration of SARS-CoV-2 viral copies this assay can detect is 250 copies / mL. A negative result does not preclude SARS-CoV-2 infection and should not be used as the sole basis for treatment or other patient management decisions.  A negative result may occur with improper specimen collection / handling, submission of specimen other than nasopharyngeal swab, presence of viral mutation(s) within the areas targeted by this assay, and inadequate number of viral copies (<250 copies / mL). A negative result must be combined with clinical observations, patient history, and epidemiological information. Fact Sheet for Patients:   BoilerBrush.com.cy Fact Sheet for Healthcare Providers: https://pope.com/ This test is not yet approved or cleared  by the [place] FDA and has been authorized for detection and/or diagnosis of SARS-CoV-2 by FDA under an Emergency Use Authorization (EUA).  This EUA will remain in effect (meaning this test can be used) for the duration of the COVID-19 declaration under Section 564(b)(1) of the Act, 21 U.S.C. section 360bbb-3(b)(1), unless the authorization is terminated or revoked  sooner. Performed at [facility], [address], [place], [place] [address]          Radiology Studies: CT ABDOMEN PELVIS W CONTRAST  Result Date: 08/11/2019 CLINICAL DATA:  Abdominal pain for 6 months question of diverticulitis. EXAM: CT ABDOMEN AND PELVIS WITH CONTRAST TECHNIQUE: Multidetector CT imaging of the abdomen and pelvis was performed using the standard protocol following bolus administration of intravenous contrast. CONTRAST:  OMNIPAQUE IOHEXOL 300 MG/ML  SOLN COMPARISON:  Most recent prior June 23, 2019 FINDINGS: Lower chest: The visualized heart size within normal limits. No pericardial fluid/thickening. No hiatal hernia. The visualized portions of the lungs are clear. Hepatobiliary: The liver is normal in density without focal abnormality.The main portal vein is patent. No evidence of calcified gallstones, gallbladder wall thickening or biliary dilatation. Pancreas: Unremarkable. No pancreatic ductal dilatation or surrounding inflammatory changes. Spleen: Normal in size without focal abnormality. Adrenals/Urinary Tract: Both adrenal glands appear normal. The kidneys and collecting system appear normal without evidence of urinary tract calculus or hydronephrosis. Bladder is unremarkable. Stomach/Bowel: The stomach and small bowel are normal in appearance. The appendix is unremarkable. Again noted is a long segment of sigmoid colon with diffuse wall thickening surrounding colonic diverticula. And adjacent mesenteric fat stranding changes with peritoneal thickening. There does however appear to be a new adjacent posterior multilocular fluid collection measuring approximately 3.5 x 1.5 cm, series 3, image 64, likely pericolonic abscess. Vascular/Lymphatic: There are no enlarged mesenteric, retroperitoneal, or pelvic lymph nodes. No significant vascular findings are present. Scattered atherosclerosis in the bilateral iliac vasculature. Reproductive: The uterus and adnexa are  unremarkable. Other: No evidence of abdominal wall mass or hernia. Musculoskeletal: No acute or significant osseous findings. IMPRESSION: *Interval progression of findings suggestive of sigmoid colonic diverticulitis now with a loculated fluid collection, likely pericolonic abscess measuring 3.5 x 1.5 cm with significant surrounding fat stranding changes and peritoneal thickening. Would recommend direct visualization upon resolution of symptoms to exclude underlying pathology given the chronicity of the symptoms. Electronically Signed   By: Jonna Clark M.D.   On: 08/11/2019 21:26        Scheduled Meds: . acetaminophen  650 mg Oral Once  . sodium chloride flush  3 mL Intravenous Q12H   Continuous Infusions: . sodium chloride 100 mL/hr at  08/12/19 1345  . piperacillin-tazobactam (ZOSYN)  IV Stopped (08/12/19 1718)     LOS: 0 days    Time spent: 35 minutes    Ramiro Harvest, MD [facility]   To contact the attending provider between 7A-7P or the covering provider during after hours 7P-7A, please log into the web site [email or web address] and access using universal [place] password for that web site. If you do not have the password, please call the hospital operator.  08/12/2019, 6:03 PM

## 2019-08-12 NOTE — Progress Notes (Signed)
Request to IR for possible image guided abdominal abscess aspiration/drain placement - patient history and imaging has been reviewed by Dr. Archer Asa today who does not feel this fluid collection is approachable via percutaneous drainage. No procedure planned in IR at this time.  This information was communicated to Dr. Janee Morn today who states understanding. Order will be cancelled - please place a new order if it is felt this patient would be benefit from a re-evaluation.  IR remains available as needed.  Lynnette Caffey, PA-C

## 2019-08-13 DIAGNOSIS — K5792 Diverticulitis of intestine, part unspecified, without perforation or abscess without bleeding: Secondary | ICD-10-CM

## 2019-08-13 DIAGNOSIS — K5720 Diverticulitis of large intestine with perforation and abscess without bleeding: Principal | ICD-10-CM

## 2019-08-13 DIAGNOSIS — R1032 Left lower quadrant pain: Secondary | ICD-10-CM

## 2019-08-13 LAB — CBC WITH DIFFERENTIAL/PLATELET
Abs Immature Granulocytes: 0.02 10*3/uL (ref 0.00–0.07)
Basophils Absolute: 0 10*3/uL (ref 0.0–0.1)
Basophils Relative: 1 %
Eosinophils Absolute: 0.1 10*3/uL (ref 0.0–0.5)
Eosinophils Relative: 1 %
HCT: 32.6 % — ABNORMAL LOW (ref 36.0–46.0)
Hemoglobin: 10.9 g/dL — ABNORMAL LOW (ref 12.0–15.0)
Immature Granulocytes: 0 %
Lymphocytes Relative: 13 %
Lymphs Abs: 0.9 10*3/uL (ref 0.7–4.0)
MCH: 29.5 pg (ref 26.0–34.0)
MCHC: 33.4 g/dL (ref 30.0–36.0)
MCV: 88.3 fL (ref 80.0–100.0)
Monocytes Absolute: 0.5 10*3/uL (ref 0.1–1.0)
Monocytes Relative: 8 %
Neutro Abs: 5.1 10*3/uL (ref 1.7–7.7)
Neutrophils Relative %: 77 %
Platelets: 219 10*3/uL (ref 150–400)
RBC: 3.69 MIL/uL — ABNORMAL LOW (ref 3.87–5.11)
RDW: 13 % (ref 11.5–15.5)
WBC: 6.6 10*3/uL (ref 4.0–10.5)
nRBC: 0 % (ref 0.0–0.2)

## 2019-08-13 LAB — BASIC METABOLIC PANEL WITH GFR
Anion gap: 5 (ref 5–15)
BUN: <5 mg/dL — ABNORMAL LOW (ref 6–20)
CO2: 25 mmol/L (ref 22–32)
Calcium: 8.1 mg/dL — ABNORMAL LOW (ref 8.9–10.3)
Chloride: 109 mmol/L (ref 98–111)
Creatinine, Ser: 0.68 mg/dL (ref 0.44–1.00)
GFR calc Af Amer: >60 mL/min (ref >60)
GFR calc non Af Amer: >60 mL/min (ref >60)
Glucose, Bld: 99 mg/dL (ref 70–99)
Potassium: 4.1 mmol/L (ref 3.5–5.1)
Sodium: 139 mmol/L (ref 135–145)

## 2019-08-13 LAB — MAGNESIUM: Magnesium: 1.9 mg/dL (ref 1.7–2.4)

## 2019-08-13 MED ORDER — MAGNESIUM SULFATE IN D5W 1-5 GM/100ML-% IV SOLN
1.0000 g | Freq: Once | INTRAVENOUS | Status: AC
  Administered 2019-08-13: 1 g via INTRAVENOUS
  Filled 2019-08-13: qty 100

## 2019-08-13 MED ORDER — LISINOPRIL 20 MG PO TABS
20.0000 mg | ORAL_TABLET | Freq: Every day | ORAL | Status: DC
  Administered 2019-08-13 – 2019-08-17 (×5): 20 mg via ORAL
  Filled 2019-08-13 (×5): qty 1

## 2019-08-13 NOTE — Progress Notes (Signed)
[facility] Progress Note (weekend coverage for Drs. Mann/Hung)  Chief Complaint: Sigmoid diverticulitis  History:  Molly Anderson still has left lower quadrant abdominal pain, under fair control with as needed use of IV opiates.  Still on a clear liquid diet, does not wish to advance beyond that yet.  Denies chest pain or dyspnea. Receiving Zosyn. Is having some intestinal "rumbling", no passage of gas or bowel movement.  Objective:   Current Facility-Administered Medications:  .  acetaminophen (TYLENOL) tablet 650 mg, 650 mg, Oral, Q6H PRN **OR** acetaminophen (TYLENOL) suppository 650 mg, 650 mg, Rectal, Q6H PRN, Doutova, Anastassia, MD .  acetaminophen (TYLENOL) tablet 650 mg, 650 mg, Oral, Once, Doutova, Anastassia, MD .  fentaNYL (SUBLIMAZE) injection 50 mcg, 50 mcg, Intravenous, Q2H PRN, Doutova, Anastassia, MD, 50 mcg at 08/13/19 0836 .  HYDROcodone-acetaminophen (NORCO/VICODIN) 5-325 MG per tablet 1-2 tablet, 1-2 tablet, Oral, Q4H PRN, Therisa Doyne, MD, 2 tablet at 08/13/19 0554 .  ondansetron (ZOFRAN) tablet 4 mg, 4 mg, Oral, Q6H PRN, 4 mg at 08/12/19 0544 **OR** ondansetron (ZOFRAN) injection 4 mg, 4 mg, Intravenous, Q6H PRN, Doutova, Anastassia, MD, 4 mg at 08/13/19 0837 .  piperacillin-tazobactam (ZOSYN) IVPB 3.375 g, 3.375 g, Intravenous, Q8H, Jeani Hawking, MD, Last Rate: 12.5 mL/hr at 08/13/19 0549, 3.375 g at 08/13/19 0549 .  sertraline (ZOLOFT) tablet 25 mg, 25 mg, Oral, Daily, Rodolph Bong, MD, 25 mg at 08/13/19 0837 .  sodium chloride flush (NS) 0.9 % injection 3 mL, 3 mL, Intravenous, Q12H, Doutova, Anastassia, MD, 3 mL at 08/12/19 2103  . piperacillin-tazobactam (ZOSYN)  IV 3.375 g (08/13/19 0549)     Vital signs in last 24 hrs: Vitals:   08/13/19 0155 08/13/19 0517  BP: 119/78 131/86  Pulse: 75 75  Resp: 18 14  Temp: 99.9 F (37.7 C) 98.5 F (36.9 C)  SpO2: 100% 100%    Intake/Output Summary (Last 24 hours) at 08/13/2019 1335 Last data filed at  08/13/2019 0900 Gross per 24 hour  Intake 2078.19 ml  Output --  Net 2078.19 ml     Physical Exam Fatigued, uncomfortable, but nontoxic.  Alert conversational and pleasant.  HEENT: sclera anicteric, oral mucosa without lesions  Neck: supple, no thyromegaly, JVD or lymphadenopathy  Cardiac: RRR without murmurs, S1S2 heard, no peripheral edema  Pulm: clear to auscultation bilaterally, normal RR and effort noted  Abdomen: soft, moderate LLQ tenderness, with active bowel sounds. No guarding or palpable hepatosplenomegaly.  No distention.  Skin; warm and dry, no jaundice  Recent Labs:  CBC Latest Ref Rng & Units 08/13/2019 08/12/2019 08/11/2019  WBC 4.0 - 10.5 K/uL 6.6 6.5 6.4  Hemoglobin 12.0 - 15.0 g/dL 10.9(L) 11.2(L) 12.5  Hematocrit 36.0 - 46.0 % 32.6(L) 33.6(L) 38.2  Platelets 150 - 400 K/uL 219 228 252    No results for input(s): INR in the last 168 hours. CMP Latest Ref Rng & Units 08/13/2019 08/12/2019 08/11/2019  Glucose 70 - 99 mg/dL 99 366(Y) 403(K)  BUN 6 - 20 mg/dL <7(Q) 5(L) 6  Creatinine 0.44 - 1.00 mg/dL 2.59 5.63 8.75  Sodium 135 - 145 mmol/L 139 140 140  Potassium 3.5 - 5.1 mmol/L 4.1 3.6 3.2(L)  Chloride 98 - 111 mmol/L 109 106 104  CO2 22 - 32 mmol/L 25 24 21(L)  Calcium 8.9 - 10.3 mg/dL 8.1(L) 8.1(L) 8.8(L)  Total Protein 6.5 - 8.1 g/dL - 5.7(L) 6.7  Total Bilirubin 0.3 - 1.2 mg/dL - 0.7 0.8  Alkaline Phos 38 - 126 U/L -  54 65  AST 15 - 41 U/L - 10(L) 13(L)  ALT 0 - 44 U/L - 10 13     Radiologic studies:  CT abdomen and pelvis report this admission was reviewed.  Dr. Ulyses Amor consult note of yesterday was reviewed  Assessment & Plan  Assessment: Sigmoid diverticulitis.  The history indicates that is probably been smoldering over the last few months and then steadily worsened to the point of requiring this hospitalization with IV antibiotics and bowel rest.  Small pericolonic abscess, interventional radiology felt he could not be drained  percutaneously.  Patient is stable on IV antibiotics, does not have a surgical abdomen.  I expect will be a slow recovery, and she requires IV antibiotics at least through the weekend.  Will be a day by day reevaluation.  She asked about timing of colonoscopy, whether she would eventually need surgery for this, but unfortunately it is too soon to know the answer to those yet.  I would continue the current treatment course through the weekend, would keep her on a clear liquid diet until Monday as well, at which point Dr. Benson Norway or man will return and reevaluate patient.  I will see her in the meantime only if called for urgent issues.  Total time 25 minutes  Nelida Meuse III Office: [phone]

## 2019-08-13 NOTE — Progress Notes (Signed)
PROGRESS NOTE    Sharah Finnell  YFV:[id number] DOB: 10/30/1965 DOA: 08/11/2019 PCP: Patient, No Pcp Per    Chief Complaint  Patient presents with  . Abdominal Pain    Brief Narrative: Patient is a 54 year old female with history of hypertension, MI, thyroid disease presented with left lower quadrant abdominal pain ongoing for the past 6 months, which has been intermittent was initially treated by her gastroenterologist with Cipro Flagyl as an outpatient however unable to tolerate Flagyl and patient subsequently changed over to Augmentin.  Patient's pain resolved however has been feeling unwell for the past 1 to 2 weeks.  Patient with worsening abdominal pain over the past day with recurrence of her pain.  Patient was scheduled for outpatient colonoscopy this year.  CT abdomen and pelvis done on admission concerning for acute diverticulitis with abscess formation.  Patient placed empirically on IV Rocephin and IV Flagyl.  Patient admitted.  GI consulted.   Assessment & Plan:   Principal Problem:   Diverticulitis Active Problems:   Intra-abdominal abscess (HCC)   Hypokalemia   Essential hypertension   Colonic diverticular abscess  1 acute diverticulitis with abscess Patient still with significant lower abdominal and left lower quadrant pain.  No further emesis.  Patient noted to have a temp of 101.1.  Blood cultures pending.  CT abdomen and pelvis was reviewed by IR who did not feel fluid collection was approachable via percutaneous drainage.  Patient was on IV Rocephin and IV Flagyl.  GI consulted and patient seen in consultation by Dr.Hung and patient switched to IV Zosyn.  It is noted per GI that patient was unable to tolerate Flagyl in the past.  Patient currently on clear liquids.  Continue clear liquids through the weekend per GI recommendations and continue IV antibiotics for now. Per GI if patient symptoms improve/resolve quickly could be transitioned to oral Augmentin on discharge.   Per GI recurrent diverticulitis will likely need surgical evaluation.  Supportive care.  GI following and appreciate input and recommendations.  2.  Hypokalemia Repleted.  Potassium at 4.1.  3.  Hypomagnesemia Magnesium sulfate 1 g IV x1.  4.  Hypertension Blood pressure noted to be borderline on admission.  Saline lock IV fluids.  Will resume home regimen lisinopril.  Follow.    DVT prophylaxis: SCDs Code Status: Full Family Communication: Updated patient.  No family at bedside. Disposition:   Status is: Inpatient    Dispo: The patient is from: Home              Anticipated d/c is to: Home              Anticipated d/c date is: 08/15/2019              Patient currently on IV antibiotics, being treated for acute diverticulitis with abscess.        Consultants:   GI: Dr. Elnoria Howard 08/12/2019  Procedures:  CT abdomen and pelvis 08/11/2019  Antimicrobials:   IV Zosyn 08/12/2019  IV Rocephin 5/13/ 2021x1 dose  IV Flagyl 5/13/ 2021x1 dose   Subjective: Patient laying in bed.  Patient states nausea and emesis this morning.  Still with some abdominal pain however slowly improving.   Objective: Vitals:   08/12/19 2048 08/13/19 0155 08/13/19 0517 08/13/19 1459  BP: 110/72 119/78 131/86 (!) 162/86  Pulse: 68 75 75 68  Resp: 18 18 14 18   Temp: 99.3 F (37.4 C) 99.9 F (37.7 C) 98.5 F (36.9 C) 98.8 F (37.1 C)  TempSrc: Oral Oral Oral Oral  SpO2: 100% 100% 100% 100%  Weight:      Height:        Intake/Output Summary (Last 24 hours) at 08/13/2019 1807 Last data filed at 08/13/2019 1800 Gross per 24 hour  Intake 2913.08 ml  Output --  Net 2913.08 ml   Filed Weights   08/11/19 1803  Weight: 60.3 kg    Examination:  General exam: NAD. Respiratory system: CTAB.  No wheezes, no crackles, no rhonchi.   Cardiovascular system: Regular rate rhythm no murmurs rubs or gallops.  No JVD.  No lower extremity edema. Gastrointestinal system: Abdomen is soft,  nondistended, positive bowel sounds, tender to palpation in the lower mid and left lower quadrant.  No rebound.  No guarding.  Central nervous system: Alert and oriented. No focal neurological deficits. Extremities: Symmetric 5 x 5 power. Skin: No rashes, lesions or ulcers Psychiatry: Judgement and insight appear normal. Mood & affect appropriate.     Data Reviewed: I have personally reviewed following labs and imaging studies  CBC: Recent Labs  Lab 08/11/19 1830 08/12/19 0502 08/13/19 0557  WBC 6.4 6.5 6.6  NEUTROABS  --  4.6 5.1  HGB 12.5 11.2* 10.9*  HCT 38.2 33.6* 32.6*  MCV 87.8 88.0 88.3  PLT 252 228 219    Basic Metabolic Panel: Recent Labs  Lab 08/11/19 1830 08/12/19 0502 08/13/19 0557  NA 140 140 139  K 3.2* 3.6 4.1  CL 104 106 109  CO2 21* 24 25  GLUCOSE 103* 107* 99  BUN 6 5* <5*  CREATININE 0.61 0.72 0.68  CALCIUM 8.8* 8.1* 8.1*  MG  --  1.6* 1.9  PHOS  --  2.5  --     GFR: Estimated Creatinine Clearance: 66.5 mL/min (by C-G formula based on SCr of 0.68 mg/dL).  Liver Function Tests: Recent Labs  Lab 08/11/19 1830 08/12/19 0502  AST 13* 10*  ALT 13 10  ALKPHOS 65 54  BILITOT 0.8 0.7  PROT 6.7 5.7*  ALBUMIN 3.9 3.1*    CBG: No results for input(s): GLUCAP in the last 168 hours.   Recent Results (from the past 240 hour(s))  Blood culture (routine x 2)     Status: None (Preliminary result)   Collection Time: 08/11/19  2:51 PM   Specimen: BLOOD LEFT WRIST  Result Value Ref Range Status   Specimen Description BLOOD LEFT WRIST  Final   Special Requests   Final    BOTTLES DRAWN AEROBIC AND ANAEROBIC Blood Culture adequate volume   Culture   Final    NO GROWTH 2 DAYS Performed at [facility], [address] [place], [place] [address]    Report Status PENDING  Incomplete  Blood culture (routine x 2)     Status: None (Preliminary result)   Collection Time: 08/11/19  6:27 PM   Specimen: BLOOD LEFT HAND  Result Value Ref Range  Status   Specimen Description BLOOD LEFT HAND  Final   Special Requests   Final    BOTTLES DRAWN AEROBIC AND ANAEROBIC Blood Culture results may not be optimal due to an inadequate volume of blood received in culture bottles   Culture   Final    NO GROWTH 2 DAYS Performed at [facility], [address], [place], [place] [address]    Report Status PENDING  Incomplete  SARS Coronavirus 2 by RT PCR (hospital order, performed in [facility] hospital lab) Nasopharyngeal Nasopharyngeal Swab  Status: None   Collection Time: 08/11/19  9:48 PM   Specimen: Nasopharyngeal Swab  Result Value Ref Range Status   SARS Coronavirus 2 NEGATIVE NEGATIVE Final    Comment: (NOTE) SARS-CoV-2 target nucleic acids are NOT DETECTED. The SARS-CoV-2 RNA is generally detectable in upper and lower respiratory specimens during the acute phase of infection. The lowest concentration of SARS-CoV-2 viral copies this assay can detect is 250 copies / mL. A negative result does not preclude SARS-CoV-2 infection and should not be used as the sole basis for treatment or other patient management decisions.  A negative result may occur with improper specimen collection / handling, submission of specimen other than nasopharyngeal swab, presence of viral mutation(s) within the areas targeted by this assay, and inadequate number of viral copies (<250 copies / mL). A negative result must be combined with clinical observations, patient history, and epidemiological information. Fact Sheet for Patients:   BoilerBrush.com.cy Fact Sheet for Healthcare Providers: https://pope.com/ This test is not yet approved or cleared  by the [place] FDA and has been authorized for detection and/or diagnosis of SARS-CoV-2 by FDA under an Emergency Use Authorization (EUA).  This EUA will remain in effect (meaning this test can be used) for the duration of the COVID-19 declaration  under Section 564(b)(1) of the Act, 21 U.S.C. section 360bbb-3(b)(1), unless the authorization is terminated or revoked sooner. Performed at [facility], [address], [place], [place] [address]          Radiology Studies: CT ABDOMEN PELVIS W CONTRAST  Result Date: 08/11/2019 CLINICAL DATA:  Abdominal pain for 6 months question of diverticulitis. EXAM: CT ABDOMEN AND PELVIS WITH CONTRAST TECHNIQUE: Multidetector CT imaging of the abdomen and pelvis was performed using the standard protocol following bolus administration of intravenous contrast. CONTRAST:  OMNIPAQUE IOHEXOL 300 MG/ML  SOLN COMPARISON:  Most recent prior June 23, 2019 FINDINGS: Lower chest: The visualized heart size within normal limits. No pericardial fluid/thickening. No hiatal hernia. The visualized portions of the lungs are clear. Hepatobiliary: The liver is normal in density without focal abnormality.The main portal vein is patent. No evidence of calcified gallstones, gallbladder wall thickening or biliary dilatation. Pancreas: Unremarkable. No pancreatic ductal dilatation or surrounding inflammatory changes. Spleen: Normal in size without focal abnormality. Adrenals/Urinary Tract: Both adrenal glands appear normal. The kidneys and collecting system appear normal without evidence of urinary tract calculus or hydronephrosis. Bladder is unremarkable. Stomach/Bowel: The stomach and small bowel are normal in appearance. The appendix is unremarkable. Again noted is a long segment of sigmoid colon with diffuse wall thickening surrounding colonic diverticula. And adjacent mesenteric fat stranding changes with peritoneal thickening. There does however appear to be a new adjacent posterior multilocular fluid collection measuring approximately 3.5 x 1.5 cm, series 3, image 64, likely pericolonic abscess. Vascular/Lymphatic: There are no enlarged mesenteric, retroperitoneal, or pelvic lymph nodes. No significant vascular  findings are present. Scattered atherosclerosis in the bilateral iliac vasculature. Reproductive: The uterus and adnexa are unremarkable. Other: No evidence of abdominal wall mass or hernia. Musculoskeletal: No acute or significant osseous findings. IMPRESSION: *Interval progression of findings suggestive of sigmoid colonic diverticulitis now with a loculated fluid collection, likely pericolonic abscess measuring 3.5 x 1.5 cm with significant surrounding fat stranding changes and peritoneal thickening. Would recommend direct visualization upon resolution of symptoms to exclude underlying pathology given the chronicity of the symptoms. Electronically Signed   By: Jonna Clark M.D.   On: 08/11/2019 21:26  Scheduled Meds: . acetaminophen  650 mg Oral Once  . sertraline  25 mg Oral Daily  . sodium chloride flush  3 mL Intravenous Q12H   Continuous Infusions: . piperacillin-tazobactam (ZOSYN)  IV 3.375 g (08/13/19 1400)     LOS: 1 day    Time spent: 35 minutes    Irine Seal, MD [facility]   To contact the attending provider between 7A-7P or the covering provider during after hours 7P-7A, please log into the web site [email or web address] and access using universal Rafael Capo password for that web site. If you do not have the password, please call the hospital operator.  08/13/2019, 6:07 PM

## 2019-08-13 NOTE — Plan of Care (Signed)

## 2019-08-14 LAB — BASIC METABOLIC PANEL
Anion gap: 11 (ref 5–15)
BUN: <5 mg/dL — ABNORMAL LOW (ref 6–20)
CO2: 24 mmol/L (ref 22–32)
Calcium: 8.4 mg/dL — ABNORMAL LOW (ref 8.9–10.3)
Chloride: 104 mmol/L (ref 98–111)
Creatinine, Ser: 0.63 mg/dL (ref 0.44–1.00)
GFR calc Af Amer: >60 mL/min (ref >60)
GFR calc non Af Amer: >60 mL/min (ref >60)
Glucose, Bld: 102 mg/dL — ABNORMAL HIGH (ref 70–99)
Potassium: 4.3 mmol/L (ref 3.5–5.1)
Sodium: 139 mmol/L (ref 135–145)

## 2019-08-14 LAB — CBC
HCT: 33 % — ABNORMAL LOW (ref 36.0–46.0)
Hemoglobin: 11.2 g/dL — ABNORMAL LOW (ref 12.0–15.0)
MCH: 29.6 pg (ref 26.0–34.0)
MCHC: 33.9 g/dL (ref 30.0–36.0)
MCV: 87.1 fL (ref 80.0–100.0)
Platelets: 249 10*3/uL (ref 150–400)
RBC: 3.79 MIL/uL — ABNORMAL LOW (ref 3.87–5.11)
RDW: 12.8 % (ref 11.5–15.5)
WBC: 4.9 10*3/uL (ref 4.0–10.5)
nRBC: 0 % (ref 0.0–0.2)

## 2019-08-14 LAB — MAGNESIUM: Magnesium: 1.9 mg/dL (ref 1.7–2.4)

## 2019-08-14 NOTE — Progress Notes (Signed)
PROGRESS NOTE    Lilliane Sposito  BPZ:[id number] DOB: 11/25/1965 DOA: 08/11/2019 PCP: Patient, No Pcp Per    Chief Complaint  Patient presents with  . Abdominal Pain    Brief Narrative: Patient is a 54 year old female with history of hypertension, MI, thyroid disease presented with left lower quadrant abdominal pain ongoing for the past 6 months, which has been intermittent was initially treated by her gastroenterologist with Cipro Flagyl as an outpatient however unable to tolerate Flagyl and patient subsequently changed over to Augmentin.  Patient's pain resolved however has been feeling unwell for the past 1 to 2 weeks.  Patient with worsening abdominal pain over the past day with recurrence of her pain.  Patient was scheduled for outpatient colonoscopy this year.  CT abdomen and pelvis done on admission concerning for acute diverticulitis with abscess formation.  Patient placed empirically on IV Rocephin and IV Flagyl.  Patient admitted.  GI consulted.   Assessment & Plan:   Principal Problem:   Diverticulitis Active Problems:   Intra-abdominal abscess (HCC)   Hypokalemia   Essential hypertension   Colonic diverticular abscess  1 acute diverticulitis with abscess Patient still with significant lower abdominal and left lower quadrant pain.  No further emesis.  Patient noted to have a temp of 101.1 on admission.  Currently afebrile..  Blood cultures pending with no growth to date.  CT abdomen and pelvis was reviewed by IR who did not feel fluid collection was approachable via percutaneous drainage.  Patient was on IV Rocephin and IV Flagyl.  GI consulted and patient seen in consultation by Dr.Hung and patient switched to IV Zosyn.  It is noted per GI that patient was unable to tolerate Flagyl in the past.  Patient currently on clear liquids which she is tolerating and will continue through today.  Continue IV antibiotics.  As patient improves once diet is advanced tolerating diet, clinical  improvement could likely transition to oral Augmentin on discharge to complete course of antibiotic treatment.  Per GI recurrent diverticulitis will likely need surgical evaluation.  Supportive care.  GI following and appreciate input and recommendations.  2.  Hypokalemia Repleted.  Potassium at 4.3.  3.  Hypomagnesemia Magnesium 1.9.  Follow.    4.  Hypertension Blood pressure noted to be borderline on admission.  Blood pressure has improved.  IV fluids have been saline lock.  Continue current regimen of lisinopril.  Follow.    DVT prophylaxis: SCDs Code Status: Full Family Communication: Updated patient.  No family at bedside. Disposition:   Status is: Inpatient    Dispo: The patient is from: Home              Anticipated d/c is to: Home              Anticipated d/c date is: 08/16/2019              Patient currently on IV antibiotics, being treated for acute diverticulitis with abscess.        Consultants:   GI: Dr. Elnoria Howard 08/12/2019  Procedures:  CT abdomen and pelvis 08/11/2019  Antimicrobials:   IV Zosyn 08/12/2019  IV Rocephin 5/13/ 2021x1 dose  IV Flagyl 5/13/ 2021x1 dose   Subjective: Patient lying in bed on the telephone.  States had some nausea this morning with some dry heaves however no emesis.  Denies any chest pain.  No shortness of breath.  States abdominal pain is improving.   Objective: Vitals:   08/13/19 1459 08/13/19 2104 08/14/19  0458 08/14/19 1435  BP: (!) 162/86 (!) 167/97 (!) 157/91 (!) 167/100  Pulse: 68 62 65 60  Resp: 18 16 17 17   Temp: 98.8 F (37.1 C) 99 F (37.2 C) 98.8 F (37.1 C) 98.4 F (36.9 C)  TempSrc: Oral Oral Oral Oral  SpO2: 100% 100% 100% 100%  Weight:      Height:        Intake/Output Summary (Last 24 hours) at 08/14/2019 1752 Last data filed at 08/14/2019 0900 Gross per 24 hour  Intake 1169.95 ml  Output --  Net 1169.95 ml   Filed Weights   08/11/19 1803  Weight: 60.3 kg    Examination:  General exam:  NAD. Respiratory system: Lungs clear to auscultation bilaterally.  No wheezes, no crackles, no rhonchi.  Normal respiratory effort. Cardiovascular system: RRR no murmurs rubs or gallops.  No JVD.  No lower extremity edema. Gastrointestinal system: Abdomen is soft, nondistended, tender to palpation in the mid lower abdomen, left lower quadrant.  No rebound.  No guardi Central nervous system: Alert and oriented. No focal neurological deficits. Extremities: Symmetric 5 x 5 power. Skin: No rashes, lesions or ulcers Psychiatry: Judgement and insight appear normal. Mood & affect appropriate.     Data Reviewed: I have personally reviewed following labs and imaging studies  CBC: Recent Labs  Lab 08/11/19 1830 08/12/19 0502 08/13/19 0557 08/14/19 0400  WBC 6.4 6.5 6.6 4.9  NEUTROABS  --  4.6 5.1  --   HGB 12.5 11.2* 10.9* 11.2*  HCT 38.2 33.6* 32.6* 33.0*  MCV 87.8 88.0 88.3 87.1  PLT 252 228 219 527    Basic Metabolic Panel: Recent Labs  Lab 08/11/19 1830 08/12/19 0502 08/13/19 0557 08/14/19 0400  NA 140 140 139 139  K 3.2* 3.6 4.1 4.3  CL 104 106 109 104  CO2 21* 24 25 24   GLUCOSE 103* 107* 99 102*  BUN 6 5* <5* <5*  CREATININE 0.61 0.72 0.68 0.63  CALCIUM 8.8* 8.1* 8.1* 8.4*  MG  --  1.6* 1.9 1.9  PHOS  --  2.5  --   --     GFR: Estimated Creatinine Clearance: 66.5 mL/min (by C-G formula based on SCr of 0.63 mg/dL).  Liver Function Tests: Recent Labs  Lab 08/11/19 1830 08/12/19 0502  AST 13* 10*  ALT 13 10  ALKPHOS 65 54  BILITOT 0.8 0.7  PROT 6.7 5.7*  ALBUMIN 3.9 3.1*    CBG: No results for input(s): GLUCAP in the last 168 hours.   Recent Results (from the past 240 hour(s))  Blood culture (routine x 2)     Status: None (Preliminary result)   Collection Time: 08/11/19  2:51 PM   Specimen: BLOOD LEFT WRIST  Result Value Ref Range Status   Specimen Description BLOOD LEFT WRIST  Final   Special Requests   Final    BOTTLES DRAWN AEROBIC AND ANAEROBIC  Blood Culture adequate volume   Culture   Final    NO GROWTH 3 DAYS Performed at [facility], [address], [place], [place] [address]    Report Status PENDING  Incomplete  Blood culture (routine x 2)     Status: None (Preliminary result)   Collection Time: 08/11/19  6:27 PM   Specimen: BLOOD LEFT HAND  Result Value Ref Range Status   Specimen Description BLOOD LEFT HAND  Final   Special Requests   Final    BOTTLES DRAWN AEROBIC AND ANAEROBIC Blood Culture results may  not be optimal due to an inadequate volume of blood received in culture bottles   Culture   Final    NO GROWTH 3 DAYS Performed at [facility], [address], [place], [place] [address]    Report Status PENDING  Incomplete  SARS Coronavirus 2 by RT PCR (hospital order, performed in [facility] hospital lab) Nasopharyngeal Nasopharyngeal Swab     Status: None   Collection Time: 08/11/19  9:48 PM   Specimen: Nasopharyngeal Swab  Result Value Ref Range Status   SARS Coronavirus 2 NEGATIVE NEGATIVE Final    Comment: (NOTE) SARS-CoV-2 target nucleic acids are NOT DETECTED. The SARS-CoV-2 RNA is generally detectable in upper and lower respiratory specimens during the acute phase of infection. The lowest concentration of SARS-CoV-2 viral copies this assay can detect is 250 copies / mL. A negative result does not preclude SARS-CoV-2 infection and should not be used as the sole basis for treatment or other patient management decisions.  A negative result may occur with improper specimen collection / handling, submission of specimen other than nasopharyngeal swab, presence of viral mutation(s) within the areas targeted by this assay, and inadequate number of viral copies (<250 copies / mL). A negative result must be combined with clinical observations, patient history, and epidemiological information. Fact Sheet for Patients:   BoilerBrush.com.cy Fact Sheet for Healthcare  Providers: https://pope.com/ This test is not yet approved or cleared  by the [place] FDA and has been authorized for detection and/or diagnosis of SARS-CoV-2 by FDA under an Emergency Use Authorization (EUA).  This EUA will remain in effect (meaning this test can be used) for the duration of the COVID-19 declaration under Section 564(b)(1) of the Act, 21 U.S.C. section 360bbb-3(b)(1), unless the authorization is terminated or revoked sooner. Performed at [facility], [address], [place], [place] [address]          Radiology Studies: No results found.      Scheduled Meds: . acetaminophen  650 mg Oral Once  . lisinopril  20 mg Oral Daily  . sertraline  25 mg Oral Daily  . sodium chloride flush  3 mL Intravenous Q12H   Continuous Infusions: . piperacillin-tazobactam (ZOSYN)  IV 3.375 g (08/14/19 1416)     LOS: 2 days    Time spent: 35 minutes    Ramiro Harvest, MD [facility]   To contact the attending provider between 7A-7P or the covering provider during after hours 7P-7A, please log into the web site [email or web address] and access using universal [place] password for that web site. If you do not have the password, please call the hospital operator.  08/14/2019, 5:52 PM

## 2019-08-14 NOTE — Plan of Care (Signed)
  Problem: Education: Goal: Knowledge of General Education information will improve Description Including pain rating scale, medication(s)/side effects and non-pharmacologic comfort measures Outcome: Progressing   Problem: Health Behavior/Discharge Planning: Goal: Ability to manage health-related needs will improve Outcome: Progressing   

## 2019-08-15 LAB — CBC WITH DIFFERENTIAL/PLATELET
Abs Immature Granulocytes: 0 10*3/uL (ref 0.00–0.07)
Basophils Absolute: 0 10*3/uL (ref 0.0–0.1)
Basophils Relative: 1 %
Eosinophils Absolute: 0.1 10*3/uL (ref 0.0–0.5)
Eosinophils Relative: 5 %
HCT: 34.1 % — ABNORMAL LOW (ref 36.0–46.0)
Hemoglobin: 11.5 g/dL — ABNORMAL LOW (ref 12.0–15.0)
Immature Granulocytes: 0 %
Lymphocytes Relative: 38 %
Lymphs Abs: 1 10*3/uL (ref 0.7–4.0)
MCH: 29.4 pg (ref 26.0–34.0)
MCHC: 33.7 g/dL (ref 30.0–36.0)
MCV: 87.2 fL (ref 80.0–100.0)
Monocytes Absolute: 0.4 10*3/uL (ref 0.1–1.0)
Monocytes Relative: 15 %
Neutro Abs: 1.1 10*3/uL — ABNORMAL LOW (ref 1.7–7.7)
Neutrophils Relative %: 41 %
Platelets: 262 10*3/uL (ref 150–400)
RBC: 3.91 MIL/uL (ref 3.87–5.11)
RDW: 13 % (ref 11.5–15.5)
WBC: 2.6 10*3/uL — ABNORMAL LOW (ref 4.0–10.5)
nRBC: 0 % (ref 0.0–0.2)

## 2019-08-15 LAB — BASIC METABOLIC PANEL
Anion gap: 7 (ref 5–15)
BUN: <5 mg/dL — ABNORMAL LOW (ref 6–20)
CO2: 26 mmol/L (ref 22–32)
Calcium: 8.6 mg/dL — ABNORMAL LOW (ref 8.9–10.3)
Chloride: 107 mmol/L (ref 98–111)
Creatinine, Ser: 0.68 mg/dL (ref 0.44–1.00)
GFR calc Af Amer: >60 mL/min (ref >60)
GFR calc non Af Amer: >60 mL/min (ref >60)
Glucose, Bld: 108 mg/dL — ABNORMAL HIGH (ref 70–99)
Potassium: 3.1 mmol/L — ABNORMAL LOW (ref 3.5–5.1)
Sodium: 140 mmol/L (ref 135–145)

## 2019-08-15 LAB — MAGNESIUM: Magnesium: 1.7 mg/dL (ref 1.7–2.4)

## 2019-08-15 LAB — PHOSPHORUS: Phosphorus: 2.8 mg/dL (ref 2.5–4.6)

## 2019-08-15 MED ORDER — POTASSIUM CHLORIDE CRYS ER 20 MEQ PO TBCR
40.0000 meq | EXTENDED_RELEASE_TABLET | ORAL | Status: AC
  Administered 2019-08-15 (×2): 40 meq via ORAL
  Filled 2019-08-15 (×2): qty 2

## 2019-08-15 MED ORDER — MAGNESIUM SULFATE 4 GM/100ML IV SOLN
4.0000 g | Freq: Once | INTRAVENOUS | Status: AC
  Administered 2019-08-15: 4 g via INTRAVENOUS
  Filled 2019-08-15: qty 100

## 2019-08-15 NOTE — Progress Notes (Signed)
Subjective: Patient seems to be doing much better today.  She rates her abdominal pain at a 4 out of 10.  She denies having nausea or vomiting.  She has not had a bowel movement since she was admitted to the hospital.  Objective: Vital signs in last 24 hours: Temp:  [97.7 F (36.5 C)-98.4 F (36.9 C)] 98.2 F (36.8 C) (05/17 1554) Pulse Rate:  [52-62] 62 (05/17 1554) Resp:  [17-18] 18 (05/17 1554) BP: (144-161)/(82-100) 153/82 (05/17 1554) SpO2:  [100 %] 100 % (05/17 1554) Last BM Date: 08/11/19  Intake/Output from previous day: 05/16 0701 - 05/17 0700 In: 468.9 [P.O.:300; I.V.:3; IV Piggyback:165.9] Out: -  Intake/Output this shift: Total I/O In: 840 [P.O.:840] Out: -   General appearance: alert, cooperative, appears stated age and no distress Resp: clear to auscultation bilaterally Cardio: regular rate and rhythm, S1, S2 normal, no murmur, click, rub or gallop GI: soft, non-tender; bowel sounds normal; no masses,  no organomegaly  Lab Results: Recent Labs    08/13/19 0557 08/14/19 0400 08/15/19 0430  WBC 6.6 4.9 2.6*  HGB 10.9* 11.2* 11.5*  HCT 32.6* 33.0* 34.1*  PLT 219 249 262   BMET Recent Labs    08/13/19 0557 08/14/19 0400 08/15/19 0430  NA 139 139 140  K 4.1 4.3 3.1*  CL 109 104 107  CO2 25 24 26   GLUCOSE 99 102* 108*  BUN <5* <5* <5*  CREATININE 0.68 0.63 0.68  CALCIUM 8.1* 8.4* 8.6*    Medications: I have reviewed the patient's current medications.  Assessment/Plan: 1) Acute diverticulitis with an abscess on Rocephin and Flagyl.  Seems to be improving slowly and steadily/. 2) Constipation since admission-she may benefit from stool softeners. 3) Hypertension. 4) Hypokalemia/hypomagnesemia being corrected.  LOS: 3 days   08/15/2019, 4:07 PM

## 2019-08-15 NOTE — Progress Notes (Signed)
PROGRESS NOTE    Molly Anderson  DPO:[id number] DOB: 1965-05-29 DOA: 08/11/2019 PCP: Molly Anderson, No Pcp Per    Chief Complaint  Molly Anderson presents with  . Abdominal Pain    Brief Narrative: Molly Anderson is a 54 year old female with history of hypertension, MI, thyroid disease presented with left lower quadrant abdominal pain ongoing for the past 6 months, which has been intermittent was initially treated by her gastroenterologist with Cipro Flagyl as an outpatient however unable to tolerate Flagyl and Molly Anderson subsequently changed over to Augmentin.  Molly Anderson's pain resolved however has been feeling unwell for the past 1 to 2 weeks.  Molly Anderson with worsening abdominal pain over the past day with recurrence of her pain.  Molly Anderson was scheduled for outpatient colonoscopy this year.  CT abdomen and pelvis done on admission concerning for acute diverticulitis with abscess formation.  Molly Anderson placed empirically on IV Rocephin and IV Flagyl.  Molly Anderson admitted.  GI consulted.   Assessment & Plan:   Principal Problem:   Diverticulitis Active Problems:   Intra-abdominal abscess ([place])   Hypokalemia   Essential hypertension   Colonic diverticular abscess  1 acute diverticulitis with abscess Molly Anderson still with significant lower abdominal and left lower quadrant pain.  No further emesis.  Molly Anderson noted to have a temp of 101.1 on admission.  Currently afebrile..  Blood cultures pending with no growth to date.  CT abdomen and pelvis was reviewed by IR who did not feel fluid collection was approachable via percutaneous drainage.  Molly Anderson was on IV Rocephin and IV Flagyl.  GI consulted and Molly Anderson seen in consultation by Dr.Hung and Molly Anderson switched to IV Zosyn.  It is noted per GI that Molly Anderson was unable to tolerate Flagyl in the past.  Molly Anderson currently on clear liquids which she is tolerating.  Advance to full liquid diet.  Continue IV antibiotics.  As Molly Anderson improves once diet is advanced tolerating diet, clinical  improvement could likely transition to oral Augmentin on discharge to complete course of antibiotic treatment.  Per GI recurrent diverticulitis will likely need surgical evaluation.  Supportive care.  GI following and appreciate input and recommendations.  2.  Hypokalemia Potassium 3.1.  K. Dur 40 mEq p.o. every 4 hours x2 doses.    3.  Hypomagnesemia Magnesium 1.7.  Magnesium sulfate 4 g IV x1.  Follow.    4.  Hypertension Blood pressure noted to be borderline on admission.  BP improvement.  Continue current dose lisinopril.     DVT prophylaxis: SCDs Code Status: Full Family Communication: Updated Molly Anderson.  No family at bedside. Disposition:   Status is: Inpatient    Dispo: The Molly Anderson is from: Home              Anticipated d/c is to: Home              Anticipated d/c date is: 08/16/2019              Molly Anderson currently on IV antibiotics, being treated for acute diverticulitis with abscess.        Consultants:   GI: Dr. Benson Norway 08/12/2019  Procedures:  CT abdomen and pelvis 08/11/2019  Antimicrobials:   IV Zosyn 08/12/2019  IV Rocephin 5/13/ 2021x1 dose  IV Flagyl 5/13/ 2021x1 dose   Subjective: Molly Anderson sitting up in chair.  Denies any further nausea or emesis.  Tolerating clear liquids.  Requesting more solid diet.  States abdominal pain improving and currently a 4/10.  Denies any chest pain or shortness of breath.  Objective: Vitals:   08/14/19 0458 08/14/19 1435 08/14/19 2118 08/15/19 0415  BP: (!) 157/91 (!) 167/100 (!) 161/100 (!) 144/84  Pulse: 65 60 (!) 52 (!) 53  Resp: 17 17 18 17   Temp: 98.8 F (37.1 C) 98.4 F (36.9 C) 97.7 F (36.5 C) 98.4 F (36.9 C)  TempSrc: Oral Oral Oral   SpO2: 100% 100% 100% 100%  Weight:      Height:        Intake/Output Summary (Last 24 hours) at 08/15/2019 1052 Last data filed at 08/15/2019 0650 Gross per 24 hour  Intake 168.93 ml  Output --  Net 168.93 ml   Filed Weights   08/11/19 1803  Weight: 60.3 kg     Examination:  General exam: NAD. Respiratory system: CTA B.  No wheezes, no crackles, no rhonchi.  Normal respiratory effort. Cardiovascular system: Regular rate and rhythm no murmurs rubs or gallops.  No JVD.  No lower extremity edema. Gastrointestinal system: Abdomen is nondistended, soft, less tender to palpation in the mid to left lower quadrant.  Positive bowel sounds.  No rebound.  No guarding.  Central nervous system: Alert and oriented. No focal neurological deficits. Extremities: Symmetric 5 x 5 power. Skin: No rashes, lesions or ulcers Psychiatry: Judgement and insight appear normal. Mood & affect appropriate.     Data Reviewed: I have personally reviewed following labs and imaging studies  CBC: Recent Labs  Lab 08/11/19 1830 08/12/19 0502 08/13/19 0557 08/14/19 0400 08/15/19 0430  WBC 6.4 6.5 6.6 4.9 2.6*  NEUTROABS  --  4.6 5.1  --  1.1*  HGB 12.5 11.2* 10.9* 11.2* 11.5*  HCT 38.2 33.6* 32.6* 33.0* 34.1*  MCV 87.8 88.0 88.3 87.1 87.2  PLT 252 228 219 249 262    Basic Metabolic Panel: Recent Labs  Lab 08/11/19 1830 08/12/19 0502 08/13/19 0557 08/14/19 0400 08/15/19 0430  NA 140 140 139 139 140  K 3.2* 3.6 4.1 4.3 3.1*  CL 104 106 109 104 107  CO2 21* 24 25 24 26   GLUCOSE 103* 107* 99 102* 108*  BUN 6 5* <5* <5* <5*  CREATININE 0.61 0.72 0.68 0.63 0.68  CALCIUM 8.8* 8.1* 8.1* 8.4* 8.6*  MG  --  1.6* 1.9 1.9 1.7  PHOS  --  2.5  --   --  2.8    GFR: Estimated Creatinine Clearance: 66.5 mL/min (by C-G formula based on SCr of 0.68 mg/dL).  Liver Function Tests: Recent Labs  Lab 08/11/19 1830 08/12/19 0502  AST 13* 10*  ALT 13 10  ALKPHOS 65 54  BILITOT 0.8 0.7  PROT 6.7 5.7*  ALBUMIN 3.9 3.1*    CBG: No results for input(s): GLUCAP in the last 168 hours.   Recent Results (from the past 240 hour(s))  Blood culture (routine x 2)     Status: None (Preliminary result)   Collection Time: 08/11/19  2:51 PM   Specimen: BLOOD LEFT WRIST   Result Value Ref Range Status   Specimen Description BLOOD LEFT WRIST  Final   Special Requests   Final    BOTTLES DRAWN AEROBIC AND ANAEROBIC Blood Culture adequate volume   Culture   Final    NO GROWTH 4 DAYS Performed at [facility], [address], [place], [address] [place]    Report Status PENDING  Incomplete  Blood culture (routine x 2)     Status: None (Preliminary result)   Collection Time: 08/11/19  6:27 PM   Specimen: BLOOD  LEFT HAND  Result Value Ref Range Status   Specimen Description BLOOD LEFT HAND  Final   Special Requests   Final    BOTTLES DRAWN AEROBIC AND ANAEROBIC Blood Culture results may not be optimal due to an inadequate volume of blood received in culture bottles   Culture   Final    NO GROWTH 4 DAYS Performed at [facility], [address], [place], [place] [address]    Report Status PENDING  Incomplete  SARS Coronavirus 2 by RT PCR (hospital order, performed in [facility] hospital lab) Nasopharyngeal Nasopharyngeal Swab     Status: None   Collection Time: 08/11/19  9:48 PM   Specimen: Nasopharyngeal Swab  Result Value Ref Range Status   SARS Coronavirus 2 NEGATIVE NEGATIVE Final    Comment: (NOTE) SARS-CoV-2 target nucleic acids are NOT DETECTED. The SARS-CoV-2 RNA is generally detectable in upper and lower respiratory specimens during the acute phase of infection. The lowest concentration of SARS-CoV-2 viral copies this assay can detect is 250 copies / mL. A negative result does not preclude SARS-CoV-2 infection and should not be used as the sole basis for treatment or other Molly Anderson management decisions.  A negative result may occur with improper specimen collection / handling, submission of specimen other than nasopharyngeal swab, presence of viral mutation(s) within the areas targeted by this assay, and inadequate number of viral copies (<250 copies / mL). A negative result must be combined with clinical observations, Molly Anderson  history, and epidemiological information. Fact Sheet for Patients:   BoilerBrush.com.cy Fact Sheet for Healthcare Providers: https://pope.com/ This test is not yet approved or cleared  by the [place] FDA and has been authorized for detection and/or diagnosis of SARS-CoV-2 by FDA under an Emergency Use Authorization (EUA).  This EUA will remain in effect (meaning this test can be used) for the duration of the COVID-19 declaration under Section 564(b)(1) of the Act, 21 U.S.C. section 360bbb-3(b)(1), unless the authorization is terminated or revoked sooner. Performed at [facility], [address] [place], [place] [address]          Radiology Studies: No results found.      Scheduled Meds: . acetaminophen  650 mg Oral Once  . lisinopril  20 mg Oral Daily  . potassium chloride  40 mEq Oral Q4H  . sertraline  25 mg Oral Daily  . sodium chloride flush  3 mL Intravenous Q12H   Continuous Infusions: . magnesium sulfate bolus IVPB    . piperacillin-tazobactam (ZOSYN)  IV 12.5 mL/hr at 08/15/19 0650     LOS: 3 days    Time spent: 35 minutes    Ramiro Harvest, MD [facility]   To contact the attending provider between 7A-7P or the covering provider during after hours 7P-7A, please log into the web site [email or web address] and access using universal [place] password for that web site. If you do not have the password, please call the hospital operator.  08/15/2019, 10:52 AM

## 2019-08-16 LAB — BASIC METABOLIC PANEL
Anion gap: 9 (ref 5–15)
BUN: <5 mg/dL — ABNORMAL LOW (ref 6–20)
CO2: 23 mmol/L (ref 22–32)
Calcium: 8.6 mg/dL — ABNORMAL LOW (ref 8.9–10.3)
Chloride: 107 mmol/L (ref 98–111)
Creatinine, Ser: 0.69 mg/dL (ref 0.44–1.00)
GFR calc Af Amer: >60 mL/min (ref >60)
GFR calc non Af Amer: >60 mL/min (ref >60)
Glucose, Bld: 91 mg/dL (ref 70–99)
Potassium: 5.6 mmol/L — ABNORMAL HIGH (ref 3.5–5.1)
Sodium: 139 mmol/L (ref 135–145)

## 2019-08-16 LAB — CBC WITH DIFFERENTIAL/PLATELET
Abs Immature Granulocytes: 0.02 10*3/uL (ref 0.00–0.07)
Basophils Absolute: 0 10*3/uL (ref 0.0–0.1)
Basophils Relative: 1 %
Eosinophils Absolute: 0.1 10*3/uL (ref 0.0–0.5)
Eosinophils Relative: 3 %
HCT: 37.1 % (ref 36.0–46.0)
Hemoglobin: 12.6 g/dL (ref 12.0–15.0)
Immature Granulocytes: 1 %
Lymphocytes Relative: 22 %
Lymphs Abs: 0.9 10*3/uL (ref 0.7–4.0)
MCH: 29.4 pg (ref 26.0–34.0)
MCHC: 34 g/dL (ref 30.0–36.0)
MCV: 86.7 fL (ref 80.0–100.0)
Monocytes Absolute: 0.4 10*3/uL (ref 0.1–1.0)
Monocytes Relative: 11 %
Neutro Abs: 2.5 10*3/uL (ref 1.7–7.7)
Neutrophils Relative %: 62 %
Platelets: 277 10*3/uL (ref 150–400)
RBC: 4.28 MIL/uL (ref 3.87–5.11)
RDW: 13 % (ref 11.5–15.5)
WBC: 3.9 10*3/uL — ABNORMAL LOW (ref 4.0–10.5)
nRBC: 0 % (ref 0.0–0.2)

## 2019-08-16 LAB — POTASSIUM: Potassium: 3.9 mmol/L (ref 3.5–5.1)

## 2019-08-16 LAB — CULTURE, BLOOD (ROUTINE X 2)
Culture: NO GROWTH
Culture: NO GROWTH
Special Requests: ADEQUATE

## 2019-08-16 LAB — MAGNESIUM: Magnesium: 2.1 mg/dL (ref 1.7–2.4)

## 2019-08-16 MED ORDER — SENNOSIDES-DOCUSATE SODIUM 8.6-50 MG PO TABS
1.0000 | ORAL_TABLET | Freq: Two times a day (BID) | ORAL | Status: DC
  Administered 2019-08-16 – 2019-08-17 (×3): 1 via ORAL
  Filled 2019-08-16 (×3): qty 1

## 2019-08-16 NOTE — Progress Notes (Signed)
PROGRESS NOTE    Molly Anderson  XIP:[id number] DOB: [birth year] DOA: 08/11/2019 PCP: No primary care provider on file.    Chief Complaint  Patient presents with  . Abdominal Pain    Brief Narrative: Patient is a 54 year old female with history of hypertension, MI, thyroid disease presented with left lower quadrant abdominal pain ongoing for the past 6 months, which has been intermittent was initially treated by her gastroenterologist with Cipro Flagyl as an outpatient however unable to tolerate Flagyl and patient subsequently changed over to Augmentin.  Patient's pain resolved however has been feeling unwell for the past 1 to 2 weeks.  Patient with worsening abdominal pain over the past day with recurrence of her pain.  Patient was scheduled for outpatient colonoscopy this year.  CT abdomen and pelvis done on admission concerning for acute diverticulitis with abscess formation.  Patient placed initially empirically on IV Rocephin and IV Flagyl. IV antibiotics changed to IV Zosyn per GI. GI ff.    Assessment & Plan:   Principal Problem:   Diverticulitis Active Problems:   Intra-abdominal abscess (HCC)   Hypokalemia   Essential hypertension   Colonic diverticular abscess  1 acute diverticulitis with abscess Patient with improving mid lower abdominal and left lower quadrant pain.  No further emesis.  Nausea improved.  Afebrile.  Patient noted to have a temp of 101.1 on admission.  Blood cultures pending with no growth to date.  CT abdomen and pelvis was reviewed by IR who did not feel fluid collection was approachable via percutaneous drainage.  Patient was on IV Rocephin and IV Flagyl.  GI consulted and patient seen in consultation by Dr.Hung and patient switched to IV Zosyn.  It is noted per GI that patient was unable to tolerate Flagyl in the past.  Patient was advanced to a full liquid diet which she tolerated.  Will advance to a soft diet.  Continue IV antibiotics.  Likely transition to  oral Augmentin on discharge and treat for 2 more weeks per GI recommendations with close outpatient follow-up with GI,  1 week post discharge. Per GI recurrent diverticulitis will likely need surgical evaluation.  Supportive care.  GI following and appreciate input and recommendations.  2.  Hypokalemia Potassium now at 5.6 this morning.  Repeat potassium levels at 3.9.  Follow.    3.  Hypomagnesemia Repleted.    4.  Hypertension Blood pressure noted to be borderline on admission.  BP improved.  Continue lisinopril.    DVT prophylaxis: SCDs Code Status: Full Family Communication: Updated patient.  No family at bedside. Disposition:   Status is: Inpatient    Dispo: The patient is from: Home              Anticipated d/c is to: Home              Anticipated d/c date is: 08/17/2019              Patient currently on IV antibiotics, being treated for acute diverticulitis with abscess.  On full liquid diet        Consultants:   GI: Dr. Elnoria Howard 08/12/2019  Procedures:  CT abdomen and pelvis 08/11/2019  Antimicrobials:   IV Zosyn 08/12/2019  IV Rocephin 5/13/ 2021x1 dose  IV Flagyl 5/13/ 2021x1 dose   Subjective: Patient sitting up in chair.  States abdominal pain improving daily.  No further nausea or emesis.  Tolerated full liquid diet.  No chest pain.  No shortness of breath.  Asking whether  diet could be advanced to a solid diet.    Objective: Vitals:   08/15/19 0415 08/15/19 1554 08/15/19 2020 08/16/19 0530  BP: (!) 144/84 (!) 153/82 (!) 142/85 (!) 142/83  Pulse: (!) 53 62 (!) 55 (!) 50  Resp: 17 18 17 15   Temp: 98.4 F (36.9 C) 98.2 F (36.8 C) 98.1 F (36.7 C) 98.4 F (36.9 C)  TempSrc:  Oral Oral Oral  SpO2: 100% 100% 100% 100%  Weight:      Height:        Intake/Output Summary (Last 24 hours) at 08/16/2019 1150 Last data filed at 08/16/2019 0946 Gross per 24 hour  Intake 716.02 ml  Output --  Net 716.02 ml   Filed Weights   08/11/19 1803  Weight:  60.3 kg    Examination:  General exam: NAD. Respiratory system: Lungs clear to auscultation bilaterally.  No wheezes, no crackles, no rhonchi.  Normal respiratory effort.  Cardiovascular system: RRR no murmurs rubs or gallops.  No JVD.  No lower extremity edema.  Gastrointestinal system: Abdomen is soft nondistended, decreasing tenderness to palpation in the lower mid abdomen and left lower quadrant.  Positive bowel sounds.  No rebound.  No guarding.  Central nervous system: Alert and oriented. No focal neurological deficits. Extremities: Symmetric 5 x 5 power. Skin: No rashes, lesions or ulcers Psychiatry: Judgement and insight appear normal. Mood & affect appropriate.     Data Reviewed: I have personally reviewed following labs and imaging studies  CBC: Recent Labs  Lab 08/11/19 1830 08/12/19 0502 08/13/19 0557 08/14/19 0400 08/15/19 0430  WBC 6.4 6.5 6.6 4.9 2.6*  NEUTROABS  --  4.6 5.1  --  1.1*  HGB 12.5 11.2* 10.9* 11.2* 11.5*  HCT 38.2 33.6* 32.6* 33.0* 34.1*  MCV 87.8 88.0 88.3 87.1 87.2  PLT 252 228 219 249 419    Basic Metabolic Panel: Recent Labs  Lab 08/12/19 0502 08/13/19 0557 08/14/19 0400 08/15/19 0430 08/16/19 0230 08/16/19 0935  NA 140 139 139 140  --  139  K 3.6 4.1 4.3 3.1*  --  5.6*  CL 106 109 104 107  --  107  CO2 24 25 24 26   --  23  GLUCOSE 107* 99 102* 108*  --  91  BUN 5* <5* <5* <5*  --  <5*  CREATININE 0.72 0.68 0.63 0.68  --  0.69  CALCIUM 8.1* 8.1* 8.4* 8.6*  --  8.6*  MG 1.6* 1.9 1.9 1.7 2.1  --   PHOS 2.5  --   --  2.8  --   --     GFR: Estimated Creatinine Clearance: 66.5 mL/min (by C-G formula based on SCr of 0.69 mg/dL).  Liver Function Tests: Recent Labs  Lab 08/11/19 1830 08/12/19 0502  AST 13* 10*  ALT 13 10  ALKPHOS 65 54  BILITOT 0.8 0.7  PROT 6.7 5.7*  ALBUMIN 3.9 3.1*    CBG: No results for input(s): GLUCAP in the last 168 hours.   Recent Results (from the past 240 hour(s))  Blood culture (routine x  2)     Status: None   Collection Time: 08/11/19  2:51 PM   Specimen: BLOOD LEFT WRIST  Result Value Ref Range Status   Specimen Description BLOOD LEFT WRIST  Final   Special Requests   Final    BOTTLES DRAWN AEROBIC AND ANAEROBIC Blood Culture adequate volume   Culture   Final    NO GROWTH 5 DAYS Performed at [facility]  [facility], [address], [place], [place] [address]    Report Status 08/16/2019 FINAL  Final  Blood culture (routine x 2)     Status: None   Collection Time: 08/11/19  6:27 PM   Specimen: BLOOD LEFT HAND  Result Value Ref Range Status   Specimen Description BLOOD LEFT HAND  Final   Special Requests   Final    BOTTLES DRAWN AEROBIC AND ANAEROBIC Blood Culture results may not be optimal due to an inadequate volume of blood received in culture bottles   Culture   Final    NO GROWTH 5 DAYS Performed at [facility], [address], [place], [place] [address]    Report Status 08/16/2019 FINAL  Final  SARS Coronavirus 2 by RT PCR (hospital order, performed in [facility] hospital lab) Nasopharyngeal Nasopharyngeal Swab     Status: None   Collection Time: 08/11/19  9:48 PM   Specimen: Nasopharyngeal Swab  Result Value Ref Range Status   SARS Coronavirus 2 NEGATIVE NEGATIVE Final    Comment: (NOTE) SARS-CoV-2 target nucleic acids are NOT DETECTED. The SARS-CoV-2 RNA is generally detectable in upper and lower respiratory specimens during the acute phase of infection. The lowest concentration of SARS-CoV-2 viral copies this assay can detect is 250 copies / mL. A negative result does not preclude SARS-CoV-2 infection and should not be used as the sole basis for treatment or other patient management decisions.  A negative result may occur with improper specimen collection / handling, submission of specimen other than nasopharyngeal swab, presence of viral mutation(s) within the areas targeted by this assay, and inadequate number of viral copies (<250 copies / mL). A  negative result must be combined with clinical observations, patient history, and epidemiological information. Fact Sheet for Patients:   BoilerBrush.com.cy Fact Sheet for Healthcare Providers: https://pope.com/ This test is not yet approved or cleared  by the [place] FDA and has been authorized for detection and/or diagnosis of SARS-CoV-2 by FDA under an Emergency Use Authorization (EUA).  This EUA will remain in effect (meaning this test can be used) for the duration of the COVID-19 declaration under Section 564(b)(1) of the Act, 21 U.S.C. section 360bbb-3(b)(1), unless the authorization is terminated or revoked sooner. Performed at [facility] Lab, [address] [place], [place] [address]          Radiology Studies: No results found.      Scheduled Meds: . acetaminophen  650 mg Oral Once  . lisinopril  20 mg Oral Daily  . senna-docusate  1 tablet Oral BID  . sertraline  25 mg Oral Daily  . sodium chloride flush  3 mL Intravenous Q12H   Continuous Infusions: . piperacillin-tazobactam (ZOSYN)  IV 3.375 g (08/16/19 0536)     LOS: 4 days    Time spent: 35 minutes    Ramiro Harvest, MD [facility]   To contact the attending provider between 7A-7P or the covering provider during after hours 7P-7A, please log into the web site [email or web address] and access using universal [place] password for that web site. If you do not have the password, please call the hospital operator.  08/16/2019, 11:50 AM

## 2019-08-16 NOTE — Progress Notes (Signed)
Subjective: No new complaints.  Still with some LLQ pain.  Objective: Vital signs in last 24 hours: Temp:  [98.1 F (36.7 C)-98.4 F (36.9 C)] 98.2 F (36.8 C) (05/18 1443) Pulse Rate:  [50-60] 60 (05/18 1443) Resp:  [15-18] 18 (05/18 1443) BP: (142-145)/(83-95) 145/95 (05/18 1443) SpO2:  [100 %] 100 % (05/18 1443) Last BM Date: 08/16/19  Intake/Output from previous day: 05/17 0701 - 05/18 0700 In: 956 [P.O.:840; IV Piggyback:116] Out: -  Intake/Output this shift: Total I/O In: 480 [P.O.:480] Out: -   General appearance: alert and no distress GI: tender in the LLQ, but improved.  Lab Results: Recent Labs    08/14/19 0400 08/15/19 0430 08/16/19 0935  WBC 4.9 2.6* 3.9*  HGB 11.2* 11.5* 12.6  HCT 33.0* 34.1* 37.1  PLT 249 262 277   BMET Recent Labs    08/14/19 0400 08/14/19 0400 08/15/19 0430 08/16/19 0935 08/16/19 1158  NA 139  --  140 139  --   K 4.3   < > 3.1* 5.6* 3.9  CL 104  --  107 107  --   CO2 24  --  26 23  --   GLUCOSE 102*  --  108* 91  --   BUN <5*  --  <5* <5*  --   CREATININE 0.63  --  0.68 0.69  --   CALCIUM 8.4*  --  8.6* 8.6*  --    < > = values in this interval not displayed.   LFT No results for input(s): PROT, ALBUMIN, AST, ALT, ALKPHOS, BILITOT, BILIDIR, IBILI in the last 72 hours. PT/INR No results for input(s): LABPROT, INR in the last 72 hours. Hepatitis Panel No results for input(s): HEPBSAG, HCVAB, HEPAIGM, HEPBIGM in the last 72 hours. C-Diff No results for input(s): CDIFFTOX in the last 72 hours. Fecal Lactopherrin No results for input(s): FECLLACTOFRN in the last 72 hours.  Studies/Results: No results found.  Medications:  Scheduled: . acetaminophen  650 mg Oral Once  . lisinopril  20 mg Oral Daily  . senna-docusate  1 tablet Oral BID  . sertraline  25 mg Oral Daily  . sodium chloride flush  3 mL Intravenous Q12H   Continuous: . piperacillin-tazobactam (ZOSYN)  IV 3.375 g (08/16/19 1426)     Assessment/Plan: 1) Acute diverticulitis. 2) Diverticular abscess.   The patient's pain is much improved compared to admission.  She is currently well with Zosyn and pain medications.  She tolerated a regular diet.  Plan: 1) Transition to Augmentin 875 mg BID x 2 weeks upon discharge. 2) D/C home with pain medications. 3) Upon discharge, she will need to follow up with Dr. Loreta Ave in one week. 4) Signing off.  LOS: 4 days   Corian Handley D 08/16/2019, 4:34 PM

## 2019-08-17 MED ORDER — AMOXICILLIN-POT CLAVULANATE 875-125 MG PO TABS: 1.0000 | ORAL_TABLET | Freq: Two times a day (BID) | ORAL | 0 refills | Status: AC

## 2019-08-17 MED ORDER — ONDANSETRON HCL 4 MG PO TABS: 4.0000 mg | ORAL_TABLET | Freq: Four times a day (QID) | ORAL | 0 refills | Status: DC | PRN

## 2019-08-17 MED ORDER — HYDROCODONE-ACETAMINOPHEN 5-325 MG PO TABS: 1.0000 | ORAL_TABLET | ORAL | 0 refills | Status: DC | PRN

## 2019-08-17 MED FILL — HYDROCODON-APAP 5-325: 5-325 | 3 days supply | Qty: 15 | Fill #0

## 2019-08-17 MED FILL — AMOX-CLAV 875-125 MG TABLET: 875-125 | 9 days supply | Qty: 18 | Fill #0

## 2019-08-17 MED FILL — ONDANSETRON HCL 4 MG TABLET: 4 | 5 days supply | Qty: 20 | Fill #0

## 2019-08-17 NOTE — Discharge Summary (Signed)
Physician Discharge Summary  Webb Silversmithdrian Ellenberger RSW:[id number] DOB: April 11, 1965 DOA: 08/11/2019  PCP: No primary care provider on file.  Admit date: 08/11/2019 Discharge date: 08/17/2019  Admitted From: Home Disposition:  Home  Recommendations for Outpatient Follow-up:  1. Follow up with PCP in 2-3 weeks 2. Follow up with primary GI in one week 3. NCCSR reviewed. No recent controlled substances are listed. Most recent was very limited quantity over one month prior  Discharge Condition:Stable CODE STATUS:Full Diet recommendation: Soft, advance as tolerated   Brief/Interim Summary: 54 year old female with history of hypertension, MI, thyroid disease presented with left lower quadrant abdominal pain ongoing for the past 6 months, which has been intermittent was initially treated by her gastroenterologist with Cipro Flagyl as an outpatient however unable to tolerate Flagyl and patient subsequently changed over to Augmentin.  Patient's pain resolved however has been feeling unwell for the past 1 to 2 weeks.  Patient with worsening abdominal pain over the past day with recurrence of her pain.  Patient was scheduled for outpatient colonoscopy this year.  CT abdomen and pelvis done on admission concerning for acute diverticulitis with abscess formation.   Discharge Diagnoses:  Principal Problem:   Diverticulitis Active Problems:   Intra-abdominal abscess (HCC)   Hypokalemia   Essential hypertension   Colonic diverticular abscess  1 acute diverticulitis with abscess Patient with improving mid lower abdominal and left lower quadrant pain.  No further emesis.  Nausea improved.  Afebrile.  Patient noted to have a temp of 101.1 on admission.  Blood cultures pending with no growth to date.  CT abdomen and pelvis was reviewed by IR who did not feel fluid collection was approachable via percutaneous drainage.  Patient was on IV Rocephin and IV Flagyl.  GI consulted and patient seen in consultation by Dr.Hung  and patient switched to IV Zosyn.  It is noted per GI that patient was unable to tolerate Flagyl in the past.  Patient was advanced to a full liquid diet which she tolerated.  Successfully advanced to a soft diet.  Have transitioned to PO augmentin and plan to treat total 2 weeks per GI recommendations with close outpatient follow-up with GI,  1 week post discharge. Per GI recurrent diverticulitis will likely need surgical evaluation.  Clear for d/c per GI 2.  Hypokalemia Normalized    3.  Hypomagnesemia Repleted.    4.  Hypertension Blood pressure noted to be borderline on admission.  BP improved.  Continue lisinopril as tolerated  Discharge Instructions   Allergies as of 08/17/2019      Reactions   Morphine And Related    Hives around IV site      Medication List    TAKE these medications   amoxicillin-clavulanate 875-125 MG tablet Commonly known as: Augmentin Take 1 tablet by mouth 2 (two) times daily for 9 days.   HYDROcodone-acetaminophen 5-325 MG tablet Commonly known as: NORCO/VICODIN Take 1-2 tablets by mouth every 4 (four) hours as needed for moderate pain.   lisinopril 20 MG tablet Commonly known as: ZESTRIL Take 20 mg by mouth daily.   ondansetron 4 MG tablet Commonly known as: ZOFRAN Take 1 tablet (4 mg total) by mouth every 6 (six) hours as needed for nausea.   sertraline 25 MG tablet Commonly known as: ZOLOFT Take 25 mg by mouth daily.      Follow-up Information    Ashok Norrisaylor, Sharon, MD .   Specialty: Internal Medicine         Allergies  Allergen  Reactions  . Morphine And Related     Hives around IV site    Consultations:  GI  Procedures/Studies: CT ABDOMEN PELVIS W CONTRAST  Result Date: 08/11/2019 CLINICAL DATA:  Abdominal pain for 6 months question of diverticulitis. EXAM: CT ABDOMEN AND PELVIS WITH CONTRAST TECHNIQUE: Multidetector CT imaging of the abdomen and pelvis was performed using the standard protocol following bolus  administration of intravenous contrast. CONTRAST:  133mL OMNIPAQUE IOHEXOL 300 MG/ML  SOLN COMPARISON:  Most recent prior June 23, 2019 FINDINGS: Lower chest: The visualized heart size within normal limits. No pericardial fluid/thickening. No hiatal hernia. The visualized portions of the lungs are clear. Hepatobiliary: The liver is normal in density without focal abnormality.The main portal vein is patent. No evidence of calcified gallstones, gallbladder wall thickening or biliary dilatation. Pancreas: Unremarkable. No pancreatic ductal dilatation or surrounding inflammatory changes. Spleen: Normal in size without focal abnormality. Adrenals/Urinary Tract: Both adrenal glands appear normal. The kidneys and collecting system appear normal without evidence of urinary tract calculus or hydronephrosis. Bladder is unremarkable. Stomach/Bowel: The stomach and small bowel are normal in appearance. The appendix is unremarkable. Again noted is a long segment of sigmoid colon with diffuse wall thickening surrounding colonic diverticula. And adjacent mesenteric fat stranding changes with peritoneal thickening. There does however appear to be a new adjacent posterior multilocular fluid collection measuring approximately 3.5 x 1.5 cm, series 3, image 64, likely pericolonic abscess. Vascular/Lymphatic: There are no enlarged mesenteric, retroperitoneal, or pelvic lymph nodes. No significant vascular findings are present. Scattered atherosclerosis in the bilateral iliac vasculature. Reproductive: The uterus and adnexa are unremarkable. Other: No evidence of abdominal wall mass or hernia. Musculoskeletal: No acute or significant osseous findings. IMPRESSION: *Interval progression of findings suggestive of sigmoid colonic diverticulitis now with a loculated fluid collection, likely pericolonic abscess measuring 3.5 x 1.5 cm with significant surrounding fat stranding changes and peritoneal thickening. Would recommend direct  visualization upon resolution of symptoms to exclude underlying pathology given the chronicity of the symptoms. Electronically Signed   By: Prudencio Pair M.D.   On: 08/11/2019 21:26    Subjective: Eager to go home today  Discharge Exam: Vitals:   08/16/19 2108 08/17/19 0524  BP: (!) 156/91 (!) 149/96  Pulse: 65 63  Resp: 18 17  Temp: 98.7 F (37.1 C) 98.4 F (36.9 C)  SpO2: 100% 100%   Vitals:   08/16/19 0530 08/16/19 1443 08/16/19 2108 08/17/19 0524  BP: (!) 142/83 (!) 145/95 (!) 156/91 (!) 149/96  Pulse: (!) 50 60 65 63  Resp: 15 18 18 17   Temp: 98.4 F (36.9 C) 98.2 F (36.8 C) 98.7 F (37.1 C) 98.4 F (36.9 C)  TempSrc: Oral Oral Oral Oral  SpO2: 100% 100% 100% 100%  Weight:      Height:        General: Pt is alert, awake, not in acute distress Cardiovascular: RRR, S1/S2 +, no rubs, no gallops Respiratory: CTA bilaterally, no wheezing, no rhonchi Abdominal: Soft, NT, ND, bowel sounds + Extremities: no edema, no cyanosis   The results of significant diagnostics from this hospitalization (including imaging, microbiology, ancillary and laboratory) are listed below for reference.     Microbiology: Recent Results (from the past 240 hour(s))  Blood culture (routine x 2)     Status: None   Collection Time: 08/11/19  2:51 PM   Specimen: BLOOD LEFT WRIST  Result Value Ref Range Status   Specimen Description BLOOD LEFT WRIST  Final   Special Requests  Final    BOTTLES DRAWN AEROBIC AND ANAEROBIC Blood Culture adequate volume   Culture   Final    NO GROWTH 5 DAYS Performed at [facility], [address], [place], [place] [address]    Report Status 08/16/2019 FINAL  Final  Blood culture (routine x 2)     Status: None   Collection Time: 08/11/19  6:27 PM   Specimen: BLOOD LEFT HAND  Result Value Ref Range Status   Specimen Description BLOOD LEFT HAND  Final   Special Requests   Final    BOTTLES DRAWN AEROBIC AND ANAEROBIC Blood Culture results may not be  optimal due to an inadequate volume of blood received in culture bottles   Culture   Final    NO GROWTH 5 DAYS Performed at [facility], [address] [place], [place] [address]    Report Status 08/16/2019 FINAL  Final  SARS Coronavirus 2 by RT PCR (hospital order, performed in [facility] hospital lab) Nasopharyngeal Nasopharyngeal Swab     Status: None   Collection Time: 08/11/19  9:48 PM   Specimen: Nasopharyngeal Swab  Result Value Ref Range Status   SARS Coronavirus 2 NEGATIVE NEGATIVE Final    Comment: (NOTE) SARS-CoV-2 target nucleic acids are NOT DETECTED. The SARS-CoV-2 RNA is generally detectable in upper and lower respiratory specimens during the acute phase of infection. The lowest concentration of SARS-CoV-2 viral copies this assay can detect is 250 copies / mL. A negative result does not preclude SARS-CoV-2 infection and should not be used as the sole basis for treatment or other patient management decisions.  A negative result may occur with improper specimen collection / handling, submission of specimen other than nasopharyngeal swab, presence of viral mutation(s) within the areas targeted by this assay, and inadequate number of viral copies (<250 copies / mL). A negative result must be combined with clinical observations, patient history, and epidemiological information. Fact Sheet for Patients:   BoilerBrush.com.cy Fact Sheet for Healthcare Providers: https://pope.com/ This test is not yet approved or cleared  by the [place] FDA and has been authorized for detection and/or diagnosis of SARS-CoV-2 by FDA under an Emergency Use Authorization (EUA).  This EUA will remain in effect (meaning this test can be used) for the duration of the COVID-19 declaration under Section 564(b)(1) of the Act, 21 U.S.C. section 360bbb-3(b)(1), unless the authorization is terminated or revoked sooner. Performed at [facility], [address], [place], [place] [address]      Labs: BNP (last 3 results) No results for input(s): BNP in the last 8760 hours. Basic Metabolic Panel: Recent Labs  Lab 08/12/19 0502 08/12/19 0502 08/13/19 0557 08/14/19 0400 08/15/19 0430 08/16/19 0230 08/16/19 0935 08/16/19 1158  NA 140  --  139 139 140  --  139  --   K 3.6   < > 4.1 4.3 3.1*  --  5.6* 3.9  CL 106  --  109 104 107  --  107  --   CO2 24  --  25 24 26   --  23  --   GLUCOSE 107*  --  99 102* 108*  --  91  --   BUN 5*  --  <5* <5* <5*  --  <5*  --   CREATININE 0.72  --  0.68 0.63 0.68  --  0.69  --   CALCIUM 8.1*  --  8.1* 8.4* 8.6*  --  8.6*  --  MG 1.6*  --  1.9 1.9 1.7 2.1  --   --   PHOS 2.5  --   --   --  2.8  --   --   --    < > = values in this interval not displayed.   Liver Function Tests: Recent Labs  Lab 08/11/19 1830 08/12/19 0502  AST 13* 10*  ALT 13 10  ALKPHOS 65 54  BILITOT 0.8 0.7  PROT 6.7 5.7*  ALBUMIN 3.9 3.1*   Recent Labs  Lab 08/11/19 1830  LIPASE 17   No results for input(s): AMMONIA in the last 168 hours. CBC: Recent Labs  Lab 08/12/19 0502 08/13/19 0557 08/14/19 0400 08/15/19 0430 08/16/19 0935  WBC 6.5 6.6 4.9 2.6* 3.9*  NEUTROABS 4.6 5.1  --  1.1* 2.5  HGB 11.2* 10.9* 11.2* 11.5* 12.6  HCT 33.6* 32.6* 33.0* 34.1* 37.1  MCV 88.0 88.3 87.1 87.2 86.7  PLT 228 219 249 262 277   Cardiac Enzymes: No results for input(s): CKTOTAL, CKMB, CKMBINDEX, TROPONINI in the last 168 hours. BNP: Invalid input(s): POCBNP CBG: No results for input(s): GLUCAP in the last 168 hours. D-Dimer No results for input(s): DDIMER in the last 72 hours. Hgb A1c No results for input(s): HGBA1C in the last 72 hours. Lipid Profile No results for input(s): CHOL, HDL, LDLCALC, TRIG, CHOLHDL, LDLDIRECT in the last 72 hours. Thyroid function studies No results for input(s): TSH, T4TOTAL, T3FREE, THYROIDAB in the last 72 hours.  Invalid input(s): FREET3 Anemia work up No  results for input(s): VITAMINB12, FOLATE, FERRITIN, TIBC, IRON, RETICCTPCT in the last 72 hours. Urinalysis    Component Value Date/Time   COLORURINE YELLOW 08/11/2019 1929   APPEARANCEUR CLEAR 08/11/2019 1929   LABSPEC 1.011 08/11/2019 1929   PHURINE 6.0 08/11/2019 1929   GLUCOSEU NEGATIVE 08/11/2019 1929   HGBUR NEGATIVE 08/11/2019 1929   BILIRUBINUR NEGATIVE 08/11/2019 1929   KETONESUR NEGATIVE 08/11/2019 1929   PROTEINUR NEGATIVE 08/11/2019 1929   NITRITE NEGATIVE 08/11/2019 1929   LEUKOCYTESUR NEGATIVE 08/11/2019 1929   Sepsis Labs Invalid input(s): PROCALCITONIN,  WBC,  LACTICIDVEN Microbiology Recent Results (from the past 240 hour(s))  Blood culture (routine x 2)     Status: None   Collection Time: 08/11/19  2:51 PM   Specimen: BLOOD LEFT WRIST  Result Value Ref Range Status   Specimen Description BLOOD LEFT WRIST  Final   Special Requests   Final    BOTTLES DRAWN AEROBIC AND ANAEROBIC Blood Culture adequate volume   Culture   Final    NO GROWTH 5 DAYS Performed at [facility], [address] [place], [place] [address]    Report Status 08/16/2019 FINAL  Final  Blood culture (routine x 2)     Status: None   Collection Time: 08/11/19  6:27 PM   Specimen: BLOOD LEFT HAND  Result Value Ref Range Status   Specimen Description BLOOD LEFT HAND  Final   Special Requests   Final    BOTTLES DRAWN AEROBIC AND ANAEROBIC Blood Culture results may not be optimal due to an inadequate volume of blood received in culture bottles   Culture   Final    NO GROWTH 5 DAYS Performed at [facility], [address] [place], [place] [address]    Report Status 08/16/2019 FINAL  Final  SARS Coronavirus 2 by RT PCR (hospital order, performed in [facility] hospital lab) Nasopharyngeal Nasopharyngeal Swab     Status: None  Collection Time: 08/11/19  9:48 PM   Specimen: Nasopharyngeal Swab  Result Value Ref Range Status   SARS Coronavirus 2 NEGATIVE NEGATIVE Final     Comment: (NOTE) SARS-CoV-2 target nucleic acids are NOT DETECTED. The SARS-CoV-2 RNA is generally detectable in upper and lower respiratory specimens during the acute phase of infection. The lowest concentration of SARS-CoV-2 viral copies this assay can detect is 250 copies / mL. A negative result does not preclude SARS-CoV-2 infection and should not be used as the sole basis for treatment or other patient management decisions.  A negative result may occur with improper specimen collection / handling, submission of specimen other than nasopharyngeal swab, presence of viral mutation(s) within the areas targeted by this assay, and inadequate number of viral copies (<250 copies / mL). A negative result must be combined with clinical observations, patient history, and epidemiological information. Fact Sheet for Patients:   BoilerBrush.com.cy Fact Sheet for Healthcare Providers: https://pope.com/ This test is not yet approved or cleared  by the [place] FDA and has been authorized for detection and/or diagnosis of SARS-CoV-2 by FDA under an Emergency Use Authorization (EUA).  This EUA will remain in effect (meaning this test can be used) for the duration of the COVID-19 declaration under Section 564(b)(1) of the Act, 21 U.S.C. section 360bbb-3(b)(1), unless the authorization is terminated or revoked sooner. Performed at [facility], [address] [place], [place] [address]    Time spent:  SIGNED:   Rickey Barbara, MD  [facility] 08/17/2019, 10:54 AM  If 7PM-7AM, please contact night-coverage

## 2019-08-17 NOTE — Plan of Care (Signed)

## 2019-08-17 NOTE — Progress Notes (Signed)
Discharged patient to home, AVS given and explained.Questions and concerns addressed to patient's satisfaction. Belongings returned accordingly. 

## 2019-10-31 ENCOUNTER — Ambulatory Visit: Payer: Self-pay | Admitting: General Surgery

## 2019-11-03 NOTE — Patient Instructions (Signed)
DUE TO COVID-19 ONLY ONE VISITOR IS ALLOWED TO COME WITH YOU AND STAY IN THE WAITING ROOM ONLY DURING PRE OP AND PROCEDURE DAY OF SURGERY. THE 2 VISITORS MAY VISIT WITH YOU AFTER SURGERY IN YOUR PRIVATE ROOM DURING VISITING HOURS ONLY!  YOU NEED TO HAVE A COVID 19 TEST ON__8-12-21_____ @_______ , THIS TEST MUST BE DONE BEFORE SURGERY,  COVID TESTING SITE [address] [place] , IT IS ON THE RIGHT GOING OUT [address] APPROXIMATELY  2 MINUTES PAST [employer] ON THE RIGHT. ONCE YOUR COVID TEST IS COMPLETED,  PLEASE BEGIN THE QUARANTINE INSTRUCTIONS AS OUTLINED IN YOUR HANDOUT.                Caren Garske  11/03/2019   Your procedure is scheduled on: 11-14-19   Report to [facility] Main  Entrance   Report to admitting at     0800  AM     Call this number if you have problems the morning of surgery [phone]    Remember:  DRINK 2 PRESURGERY ENSURE DRINKS THE NIGHT BEFORE SURGERY AT  1000 PM AND 1 PRESURGERY DRINK THE DAY OF THE PROCEDURE 3 HOURS PRIOR TO SCHEDULED SURGERY. NO SOLIDS AFTER MIDNIGHT THE DAY PRIOR TO THE SURGERY. NOTHING BY MOUTH EXCEPT CLEAR LIQUIDS UNTIL THREE HOURS PRIOR TO SCHEDULED SURGERY. PLEASE FINISH PRESURGERY ENSURE DRINK PER SURGEON ORDER 3 HOURS PRIOR TO SCHEDULED SURGERY TIME WHICH NEEDS TO BE COMPLETED AT __0700 am then nothing by mouth_______.  FOLLOW BOWEL PREP PER MD ORDER FOLLOW A CLEAR LIQUID DIET THE DAY OF YOUR BOWEL PREP TO PREVENT DEHYDRATION   CLEAR LIQUID DIET   Foods Allowed                                                                           Foods Excluded  Coffee and tea, regular and decaf no creamer                             liquids that you cannot  Plain Jell-O any favor except red or purple                                           see through such as: Fruit ices (not with fruit pulp)                                                  milk, soups, orange juice  Iced Popsicles                                                     All solid food  Cranberry, grape and apple juices Sports drinks like Gatorade Lightly seasoned clear broth or consume(fat free) Sugar, honey syrup  _____________________________________________________________________      BRUSH YOUR TEETH MORNING OF SURGERY AND RINSE YOUR MOUTH OUT, NO CHEWING GUM CANDY OR MINTS.     Take these medicines the morning of surgery with A SIP OF WATER: zoloft                                 You may not have any metal on your body including hair pins and              piercings  Do not wear jewelry, make-up, lotions, powders or perfumes, deodorant             Do not wear nail polish on your fingernails.  Do not shave  48 hours prior to surgery.                Do not bring valuables to the hospital. [facility] IS NOT             RESPONSIBLE   FOR VALUABLES.  Contacts, dentures or bridgework may not be worn into surgery.      Patients discharged the day of surgery will not be allowed to drive home. IF YOU ARE HAVING SURGERY AND GOING HOME THE SAME DAY, YOU MUST HAVE AN ADULT TO DRIVE YOU HOME AND BE WITH YOU FOR 24 HOURS. YOU MAY GO HOME BY TAXI OR UBER OR ORTHERWISE, BUT AN ADULT MUST ACCOMPANY YOU HOME AND STAY WITH YOU FOR 24 HOURS.  Name and phone number of your driver:  Special Instructions: N/A              Please read over the following fact sheets you were given: _____________________________________________________________________             [facility] - Preparing for Surgery Before surgery, you can play an important role.  Because skin is not sterile, your skin needs to be as free of germs as possible.  You can reduce the number of germs on your skin by washing with CHG (chlorahexidine gluconate) soap before surgery.  CHG is an antiseptic cleaner which kills germs and bonds with the skin to continue killing germs even after washing. Please DO NOT use if you have an  allergy to CHG or antibacterial soaps.  If your skin becomes reddened/irritated stop using the CHG and inform your nurse when you arrive at Short Stay. Do not shave (including legs and underarms) for at least 48 hours prior to the first CHG shower.  You may shave your face/neck. Please follow these instructions carefully:  1.  Shower with CHG Soap the night before surgery and the  morning of Surgery.  2.  If you choose to wash your hair, wash your hair first as usual with your  normal  shampoo.  3.  After you shampoo, rinse your hair and body thoroughly to remove the  shampoo.                           4.  Use CHG as you would any other liquid soap.  You can apply chg directly  to the skin and wash                       Gently with a scrungie or clean  washcloth.  5.  Apply the CHG Soap to your body ONLY FROM THE NECK DOWN.   Do not use on face/ open                           Wound or open sores. Avoid contact with eyes, ears mouth and genitals (private parts).                       Wash face,  Genitals (private parts) with your normal soap.             6.  Wash thoroughly, paying special attention to the area where your surgery  will be performed.  7.  Thoroughly rinse your body with warm water from the neck down.  8.  DO NOT shower/wash with your normal soap after using and rinsing off  the CHG Soap.                9.  Pat yourself dry with a clean towel.            10.  Wear clean pajamas.            11.  Place clean sheets on your bed the night of your first shower and do not  sleep with pets. Day of Surgery : Do not apply any lotions/deodorants the morning of surgery.  Please wear clean clothes to the hospital/surgery center.  FAILURE TO FOLLOW THESE INSTRUCTIONS MAY RESULT IN THE CANCELLATION OF YOUR SURGERY PATIENT SIGNATURE_________________________________  NURSE  SIGNATURE__________________________________  ________________________________________________________________________   Adam Phenix  An incentive spirometer is a tool that can help keep your lungs clear and active. This tool measures how well you are filling your lungs with each breath. Taking long deep breaths may help reverse or decrease the chance of developing breathing (pulmonary) problems (especially infection) following:  A long period of time when you are unable to move or be active. BEFORE THE PROCEDURE   If the spirometer includes an indicator to show your best effort, your nurse or respiratory therapist will set it to a desired goal.  If possible, sit up straight or lean slightly forward. Try not to slouch.  Hold the incentive spirometer in an upright position. INSTRUCTIONS FOR USE  1. Sit on the edge of your bed if possible, or sit up as far as you can in bed or on a chair. 2. Hold the incentive spirometer in an upright position. 3. Breathe out normally. 4. Place the mouthpiece in your mouth and seal your lips tightly around it. 5. Breathe in slowly and as deeply as possible, raising the piston or the ball toward the top of the column. 6. Hold your breath for 3-5 seconds or for as long as possible. Allow the piston or ball to fall to the bottom of the column. 7. Remove the mouthpiece from your mouth and breathe out normally. 8. Rest for a few seconds and repeat Steps 1 through 7 at least 10 times every 1-2 hours when you are awake. Take your time and take a few normal breaths between deep breaths. 9. The spirometer may include an indicator to show your best effort. Use the indicator as a goal to work toward during each repetition. 10. After each set of 10 deep breaths, practice coughing to be sure your lungs are clear. If you have an incision (the cut made at the time of surgery), support your incision when coughing by  placing a pillow or rolled up towels firmly  against it. Once you are able to get out of bed, walk around indoors and cough well. You may stop using the incentive spirometer when instructed by your caregiver.  RISKS AND COMPLICATIONS  Take your time so you do not get dizzy or light-headed.  If you are in pain, you may need to take or ask for pain medication before doing incentive spirometry. It is harder to take a deep breath if you are having pain. AFTER USE  Rest and breathe slowly and easily.  It can be helpful to keep track of a log of your progress. Your caregiver can provide you with a simple table to help with this. If you are using the spirometer at home, follow these instructions: [place] IF:   You are having difficultly using the spirometer.  You have trouble using the spirometer as often as instructed.  Your pain medication is not giving enough relief while using the spirometer.  You develop fever of 100.5 F (38.1 C) or higher. SEEK IMMEDIATE MEDICAL CARE IF:   You cough up bloody sputum that had not been present before.  You develop fever of 102 F (38.9 C) or greater.  You develop worsening pain at or near the incision site. MAKE SURE YOU:   Understand these instructions.  Will watch your condition.  Will get help right away if you are not doing well or get worse. Document Released: 07/28/2006 Document Revised: 06/09/2011 Document Reviewed: 09/28/2006 [facility] Patient Information 2014 [place], [place].   ________________________________________________________________________

## 2019-11-03 NOTE — Progress Notes (Addendum)
PCP - Ashok Norris NP in [facility] Cardiologist - no  PPM/ICD -  Device Orders -  Rep Notified -   Chest x-ray -  EKG - 11-07-19 Stress Test -  ECHO -  Cardiac Cath -   Sleep Study -  CPAP -   Fasting Blood Sugar -  Checks Blood Sugar _____ times a day  Blood Thinner Instructions: Aspirin Instructions:  ERAS Protcol - PRE-SURGERY Ensure   COVID TEST- 11-10-19  Activity -walks everyday around the neighborhood without SOB Anesthesia review: wbc   Patient denies shortness of breath, fever, cough and chest pain at PAT appointment   All instructions explained to the patient, with a verbal understanding of the material. Patient agrees to go over the instructions while at home for a better understanding. Patient also instructed to self quarantine after being tested for COVID-19. The opportunity to ask questions was provided.

## 2019-11-07 ENCOUNTER — Other Ambulatory Visit: Payer: Self-pay

## 2019-11-07 ENCOUNTER — Encounter (HOSPITAL_COMMUNITY)
Admission: RE | Admit: 2019-11-07 | Discharge: 2019-11-07 | Disposition: A | Discharge status: Home / Self Care | Payer: BC Managed Care – PPO | Source: Ambulatory Visit | Attending: General Surgery | Admitting: General Surgery

## 2019-11-07 ENCOUNTER — Encounter (HOSPITAL_COMMUNITY): Payer: Self-pay

## 2019-11-07 DIAGNOSIS — Z01818 Encounter for other preprocedural examination: Secondary | ICD-10-CM | POA: Diagnosis present

## 2019-11-07 HISTORY — DX: Depression, unspecified: F32.A

## 2019-11-07 HISTORY — DX: Thyrotoxicosis, unspecified without thyrotoxic crisis or storm: E05.90

## 2019-11-07 LAB — BASIC METABOLIC PANEL
Anion gap: 9 (ref 5–15)
BUN: 11 mg/dL (ref 6–20)
CO2: 28 mmol/L (ref 22–32)
Calcium: 9.3 mg/dL (ref 8.9–10.3)
Chloride: 106 mmol/L (ref 98–111)
Creatinine, Ser: 0.58 mg/dL (ref 0.44–1.00)
GFR calc Af Amer: >60 mL/min (ref >60)
GFR calc non Af Amer: >60 mL/min (ref >60)
Glucose, Bld: 93 mg/dL (ref 70–99)
Potassium: 3.9 mmol/L (ref 3.5–5.1)
Sodium: 143 mmol/L (ref 135–145)

## 2019-11-07 LAB — CBC
HCT: 39.2 % (ref 36.0–46.0)
Hemoglobin: 13.2 g/dL (ref 12.0–15.0)
MCH: 29.8 pg (ref 26.0–34.0)
MCHC: 33.7 g/dL (ref 30.0–36.0)
MCV: 88.5 fL (ref 80.0–100.0)
Platelets: 252 10*3/uL (ref 150–400)
RBC: 4.43 MIL/uL (ref 3.87–5.11)
RDW: 14.6 % (ref 11.5–15.5)
WBC: 2.6 10*3/uL — ABNORMAL LOW (ref 4.0–10.5)
nRBC: 0 % (ref 0.0–0.2)

## 2019-11-07 LAB — HEMOGLOBIN A1C
Hgb A1c MFr Bld: 5.5 % (ref 4.8–5.6)
Mean Plasma Glucose: 111.15 mg/dL

## 2019-11-07 NOTE — Consult Note (Signed)
WOC Nurse requested for preoperative stoma site marking  Discussed surgical procedure and stoma creation with patient and family.  Explained role of the WOC nurse team.  Provided the patient with educational booklet and provided samples of pouching options.  Answered patient questions.   Examined patient sitting, and standing in order to place the marking in the patient's visual field, away from any creases or abdominal contour issues and within the rectus muscle.  Attempted to mark below the patient's belt line.   Marked for colostomy in the LLQ  __6__ cm to the left of the umbilicus and _1.5__cm below the umbilicus.   Patient's abdomen cleansed with CHG wipes at site markings, allowed to air dry prior to marking.Covered mark with thin film transparent dressing to preserve mark until date of surgery.   WOC Nurse team will follow up with patient after surgery for continue ostomy care and teaching.  Taelyn Broecker [facility] MSN, RN,CWOCN, CNS, [employer] [phone]

## 2019-11-10 ENCOUNTER — Other Ambulatory Visit (HOSPITAL_COMMUNITY)
Admission: RE | Admit: 2019-11-10 | Discharge: 2019-11-10 | Disposition: A | Discharge status: Home / Self Care | Payer: BC Managed Care – PPO | Source: Ambulatory Visit | Attending: General Surgery | Admitting: General Surgery

## 2019-11-10 DIAGNOSIS — Z01812 Encounter for preprocedural laboratory examination: Secondary | ICD-10-CM | POA: Insufficient documentation

## 2019-11-10 DIAGNOSIS — Z20822 Contact with and (suspected) exposure to covid-19: Secondary | ICD-10-CM | POA: Diagnosis not present

## 2019-11-10 LAB — SARS CORONAVIRUS 2 (TAT 6-24 HRS): SARS Coronavirus 2: NEGATIVE

## 2019-11-13 ENCOUNTER — Encounter (HOSPITAL_COMMUNITY): Payer: Self-pay | Admitting: General Surgery

## 2019-11-14 ENCOUNTER — Encounter (HOSPITAL_COMMUNITY)
Admission: RE | Disposition: A | Discharge status: Home / Self Care | Payer: Self-pay | Source: Home / Self Care | Attending: General Surgery

## 2019-11-14 ENCOUNTER — Inpatient Hospital Stay (HOSPITAL_COMMUNITY): Payer: BC Managed Care – PPO | Admitting: Anesthesiology

## 2019-11-14 ENCOUNTER — Other Ambulatory Visit: Payer: Self-pay

## 2019-11-14 ENCOUNTER — Encounter (HOSPITAL_COMMUNITY): Payer: Self-pay | Admitting: General Surgery

## 2019-11-14 ENCOUNTER — Inpatient Hospital Stay (HOSPITAL_COMMUNITY)
Admission: RE | Admit: 2019-11-14 | Discharge: 2019-11-17 | DRG: 330 | Disposition: A | Discharge status: Home / Self Care | Payer: BC Managed Care – PPO | Attending: General Surgery | Admitting: General Surgery

## 2019-11-14 DIAGNOSIS — Z87891 Personal history of nicotine dependence: Secondary | ICD-10-CM

## 2019-11-14 DIAGNOSIS — F329 Major depressive disorder, single episode, unspecified: Secondary | ICD-10-CM | POA: Diagnosis present

## 2019-11-14 DIAGNOSIS — K5730 Diverticulosis of large intestine without perforation or abscess without bleeding: Secondary | ICD-10-CM | POA: Diagnosis present

## 2019-11-14 DIAGNOSIS — I10 Essential (primary) hypertension: Secondary | ICD-10-CM | POA: Diagnosis present

## 2019-11-14 DIAGNOSIS — K5732 Diverticulitis of large intestine without perforation or abscess without bleeding: Secondary | ICD-10-CM | POA: Diagnosis present

## 2019-11-14 DIAGNOSIS — K660 Peritoneal adhesions (postprocedural) (postinfection): Secondary | ICD-10-CM | POA: Diagnosis present

## 2019-11-14 DIAGNOSIS — I252 Old myocardial infarction: Secondary | ICD-10-CM

## 2019-11-14 DIAGNOSIS — K56699 Other intestinal obstruction unspecified as to partial versus complete obstruction: Secondary | ICD-10-CM | POA: Diagnosis present

## 2019-11-14 HISTORY — PX: COLON RESECTION: SHX5231

## 2019-11-14 LAB — CBC
HCT: 39.9 % (ref 36.0–46.0)
Hemoglobin: 13.7 g/dL (ref 12.0–15.0)
MCH: 29.8 pg (ref 26.0–34.0)
MCHC: 34.3 g/dL (ref 30.0–36.0)
MCV: 86.7 fL (ref 80.0–100.0)
Platelets: UNDETERMINED 10*3/uL (ref 150–400)
RBC: 4.6 MIL/uL (ref 3.87–5.11)
RDW: 14.2 % (ref 11.5–15.5)
WBC: 12.1 10*3/uL — ABNORMAL HIGH (ref 4.0–10.5)
nRBC: 0 % (ref 0.0–0.2)

## 2019-11-14 LAB — TYPE AND SCREEN
ABO/RH(D): O POS
Antibody Screen: NEGATIVE

## 2019-11-14 LAB — CREATININE, SERUM
Creatinine, Ser: 0.75 mg/dL (ref 0.44–1.00)
GFR calc Af Amer: >60 mL/min (ref >60)
GFR calc non Af Amer: >60 mL/min (ref >60)

## 2019-11-14 SURGERY — LAPAROSCOPIC SIGMOID COLON RESECTION
Anesthesia: General

## 2019-11-14 MED ORDER — HYDROMORPHONE HCL 1 MG/ML IJ SOLN
1.0000 mg | INTRAMUSCULAR | Status: DC | PRN
  Administered 2019-11-15: 11:00:00 1 mg via INTRAVENOUS
  Filled 2019-11-14: qty 1

## 2019-11-14 MED ORDER — HYDROMORPHONE HCL 1 MG/ML IJ SOLN
INTRAMUSCULAR | Status: AC
  Administered 2019-11-14: 0.5 mg via INTRAVENOUS
  Filled 2019-11-14: qty 1

## 2019-11-14 MED ORDER — CHLORHEXIDINE GLUCONATE 0.12 % MT SOLN
15.0000 mL | Freq: Once | OROMUCOSAL | Status: AC
  Administered 2019-11-14: 15 mL via OROMUCOSAL

## 2019-11-14 MED ORDER — ROCURONIUM BROMIDE 10 MG/ML (PF) SYRINGE
PREFILLED_SYRINGE | INTRAVENOUS | Status: DC | PRN
  Administered 2019-11-14: 70 mg via INTRAVENOUS
  Administered 2019-11-14: 10 mg via INTRAVENOUS

## 2019-11-14 MED ORDER — ENSURE PRE-SURGERY PO LIQD
296.0000 mL | Freq: Once | ORAL | Status: DC
  Filled 2019-11-14: qty 296

## 2019-11-14 MED ORDER — BUPIVACAINE-EPINEPHRINE (PF) 0.25% -1:200000 IJ SOLN
INTRAMUSCULAR | Status: AC
  Filled 2019-11-14: qty 30

## 2019-11-14 MED ORDER — ONDANSETRON HCL 4 MG PO TABS: 4.0000 mg | ORAL_TABLET | Freq: Four times a day (QID) | ORAL | Status: DC | PRN

## 2019-11-14 MED ORDER — ENOXAPARIN SODIUM 40 MG/0.4ML ~~LOC~~ SOLN
40.0000 mg | SUBCUTANEOUS | Status: DC
  Administered 2019-11-15 – 2019-11-17 (×3): 40 mg via SUBCUTANEOUS
  Filled 2019-11-14 (×3): qty 0.4

## 2019-11-14 MED ORDER — ESMOLOL HCL 100 MG/10ML IV SOLN
INTRAVENOUS | Status: DC | PRN
Start: 2019-11-14 — End: 2019-11-14
  Administered 2019-11-14 (×2): 10 mg via INTRAVENOUS
  Administered 2019-11-14 (×2): 20 mg via INTRAVENOUS
  Administered 2019-11-14 (×4): 10 mg via INTRAVENOUS

## 2019-11-14 MED ORDER — ORAL CARE MOUTH RINSE: 15.0000 mL | Freq: Once | OROMUCOSAL | Status: AC

## 2019-11-14 MED ORDER — ONDANSETRON HCL 4 MG/2ML IJ SOLN: 4.0000 mg | Freq: Four times a day (QID) | INTRAMUSCULAR | Status: DC | PRN

## 2019-11-14 MED ORDER — PROMETHAZINE HCL 25 MG/ML IJ SOLN
6.2500 mg | INTRAMUSCULAR | Status: DC | PRN
  Administered 2019-11-14: 6.25 mg via INTRAVENOUS

## 2019-11-14 MED ORDER — SODIUM CHLORIDE 0.9 % IV SOLN
INTRAVENOUS | Status: DC
  Filled 2019-11-14: qty 6

## 2019-11-14 MED ORDER — DIPHENHYDRAMINE HCL 50 MG/ML IJ SOLN: 25.0000 mg | Freq: Four times a day (QID) | INTRAMUSCULAR | Status: DC | PRN

## 2019-11-14 MED ORDER — PROMETHAZINE HCL 25 MG/ML IJ SOLN
INTRAMUSCULAR | Status: AC
  Filled 2019-11-14: qty 1

## 2019-11-14 MED ORDER — HEPARIN SODIUM (PORCINE) 5000 UNIT/ML IJ SOLN
5000.0000 [IU] | Freq: Once | INTRAMUSCULAR | Status: AC
  Administered 2019-11-14: 5000 [IU] via SUBCUTANEOUS
  Filled 2019-11-14: qty 1

## 2019-11-14 MED ORDER — PROPOFOL 10 MG/ML IV BOLUS
INTRAVENOUS | Status: DC | PRN
  Administered 2019-11-14: 170 mg via INTRAVENOUS

## 2019-11-14 MED ORDER — KETAMINE HCL 10 MG/ML IJ SOLN
INTRAMUSCULAR | Status: DC | PRN
Start: 2019-11-14 — End: 2019-11-14
  Administered 2019-11-14: 30 mg via INTRAVENOUS
  Administered 2019-11-14 (×2): 10 mg via INTRAVENOUS

## 2019-11-14 MED ORDER — METOPROLOL TARTRATE 5 MG/5ML IV SOLN: 5.0000 mg | Freq: Four times a day (QID) | INTRAVENOUS | Status: DC | PRN

## 2019-11-14 MED ORDER — ONDANSETRON HCL 4 MG/2ML IJ SOLN
INTRAMUSCULAR | Status: DC | PRN
  Administered 2019-11-14 (×2): 4 mg via INTRAVENOUS

## 2019-11-14 MED ORDER — 0.9 % SODIUM CHLORIDE (POUR BTL) OPTIME
TOPICAL | Status: DC | PRN
  Administered 2019-11-14: 1000 mL

## 2019-11-14 MED ORDER — DIPHENHYDRAMINE HCL 25 MG PO CAPS: 25.0000 mg | ORAL_CAPSULE | Freq: Four times a day (QID) | ORAL | Status: DC | PRN

## 2019-11-14 MED ORDER — SIMETHICONE 80 MG PO CHEW: 40.0000 mg | CHEWABLE_TABLET | Freq: Four times a day (QID) | ORAL | Status: DC | PRN

## 2019-11-14 MED ORDER — BUPIVACAINE LIPOSOME 1.3 % IJ SUSP
20.0000 mL | Freq: Once | INTRAMUSCULAR | Status: DC
  Filled 2019-11-14: qty 20

## 2019-11-14 MED ORDER — FENTANYL CITRATE (PF) 100 MCG/2ML IJ SOLN
INTRAMUSCULAR | Status: DC | PRN
  Administered 2019-11-14 (×2): 100 ug via INTRAVENOUS

## 2019-11-14 MED ORDER — LIDOCAINE 2% (20 MG/ML) 5 ML SYRINGE
INTRAMUSCULAR | Status: DC | PRN
  Administered 2019-11-14: 1.5 mg/kg/h via INTRAVENOUS
  Administered 2019-11-14: 60 mg via INTRAVENOUS

## 2019-11-14 MED ORDER — ALVIMOPAN 12 MG PO CAPS
12.0000 mg | ORAL_CAPSULE | Freq: Two times a day (BID) | ORAL | Status: DC
  Administered 2019-11-15 – 2019-11-16 (×2): 12 mg via ORAL
  Filled 2019-11-14 (×2): qty 1

## 2019-11-14 MED ORDER — HYDROMORPHONE HCL 1 MG/ML IJ SOLN
0.2500 mg | INTRAMUSCULAR | Status: DC | PRN
  Administered 2019-11-14: 0.5 mg via INTRAVENOUS

## 2019-11-14 MED ORDER — LACTATED RINGERS IV SOLN: INTRAVENOUS | Status: DC

## 2019-11-14 MED ORDER — SUGAMMADEX SODIUM 200 MG/2ML IV SOLN
INTRAVENOUS | Status: DC | PRN
  Administered 2019-11-14: 200 mg via INTRAVENOUS

## 2019-11-14 MED ORDER — OXYCODONE HCL 5 MG PO TABS
5.0000 mg | ORAL_TABLET | Freq: Four times a day (QID) | ORAL | Status: DC | PRN
  Administered 2019-11-15 (×4): 5 mg via ORAL
  Filled 2019-11-14 (×5): qty 1

## 2019-11-14 MED ORDER — ACETAMINOPHEN 325 MG PO TABS: 650.0000 mg | ORAL_TABLET | Freq: Four times a day (QID) | ORAL | Status: DC | PRN

## 2019-11-14 MED ORDER — BISACODYL 5 MG PO TBEC: 20.0000 mg | DELAYED_RELEASE_TABLET | Freq: Once | ORAL | Status: DC

## 2019-11-14 MED ORDER — MIDAZOLAM HCL 5 MG/5ML IJ SOLN
INTRAMUSCULAR | Status: DC | PRN
  Administered 2019-11-14: 2 mg via INTRAVENOUS

## 2019-11-14 MED ORDER — BUPIVACAINE LIPOSOME 1.3 % IJ SUSP
INTRAMUSCULAR | Status: DC | PRN
  Administered 2019-11-14: 20 mL

## 2019-11-14 MED ORDER — POLYETHYLENE GLYCOL 3350 17 GM/SCOOP PO POWD: 1.0000 | Freq: Once | ORAL | Status: DC

## 2019-11-14 MED ORDER — KETOROLAC TROMETHAMINE 30 MG/ML IJ SOLN
30.0000 mg | Freq: Four times a day (QID) | INTRAMUSCULAR | Status: DC
  Administered 2019-11-14 – 2019-11-17 (×11): 30 mg via INTRAVENOUS
  Filled 2019-11-14 (×11): qty 1

## 2019-11-14 MED ORDER — BUPIVACAINE-EPINEPHRINE 0.25% -1:200000 IJ SOLN
INTRAMUSCULAR | Status: DC | PRN
  Administered 2019-11-14: 30 mL

## 2019-11-14 MED ORDER — LACTATED RINGERS IR SOLN
Status: DC | PRN
  Administered 2019-11-14: 1000 mL

## 2019-11-14 MED ORDER — ENSURE SURGERY PO LIQD
237.0000 mL | Freq: Two times a day (BID) | ORAL | Status: DC
  Administered 2019-11-15 – 2019-11-17 (×3): 237 mL via ORAL

## 2019-11-14 MED ORDER — PROPOFOL 10 MG/ML IV BOLUS
INTRAVENOUS | Status: AC
  Filled 2019-11-14: qty 40

## 2019-11-14 MED ORDER — CHLORHEXIDINE GLUCONATE 4 % EX LIQD: 1.0000 "application " | Freq: Once | CUTANEOUS | Status: DC

## 2019-11-14 MED ORDER — SODIUM CHLORIDE 0.45 % IV SOLN: INTRAVENOUS | Status: DC

## 2019-11-14 MED ORDER — ENSURE PRE-SURGERY PO LIQD
592.0000 mL | Freq: Once | ORAL | Status: DC
  Filled 2019-11-14: qty 592

## 2019-11-14 MED ORDER — GABAPENTIN 300 MG PO CAPS
300.0000 mg | ORAL_CAPSULE | Freq: Two times a day (BID) | ORAL | Status: DC
  Administered 2019-11-14 – 2019-11-16 (×5): 300 mg via ORAL
  Filled 2019-11-14 (×5): qty 1

## 2019-11-14 MED ORDER — HYDRALAZINE HCL 20 MG/ML IJ SOLN
INTRAMUSCULAR | Status: AC
  Administered 2019-11-14: 10 mg via INTRAVENOUS
  Filled 2019-11-14: qty 1

## 2019-11-14 MED ORDER — SERTRALINE HCL 25 MG PO TABS
25.0000 mg | ORAL_TABLET | Freq: Every day | ORAL | Status: DC
  Administered 2019-11-15 – 2019-11-16 (×2): 25 mg via ORAL
  Filled 2019-11-14 (×2): qty 1

## 2019-11-14 MED ORDER — EPHEDRINE SULFATE-NACL 50-0.9 MG/10ML-% IV SOSY
PREFILLED_SYRINGE | INTRAVENOUS | Status: DC | PRN
  Administered 2019-11-14 (×2): 5 mg via INTRAVENOUS

## 2019-11-14 MED ORDER — GABAPENTIN 300 MG PO CAPS
300.0000 mg | ORAL_CAPSULE | ORAL | Status: AC
  Administered 2019-11-14: 300 mg via ORAL
  Filled 2019-11-14: qty 1

## 2019-11-14 MED ORDER — HYDRALAZINE HCL 20 MG/ML IJ SOLN
INTRAMUSCULAR | Status: AC
  Filled 2019-11-14: qty 1

## 2019-11-14 MED ORDER — NEOMYCIN SULFATE 500 MG PO TABS: 1000.0000 mg | ORAL_TABLET | ORAL | Status: DC

## 2019-11-14 MED ORDER — METRONIDAZOLE 500 MG PO TABS: 1000.0000 mg | ORAL_TABLET | ORAL | Status: DC

## 2019-11-14 MED ORDER — DEXAMETHASONE SODIUM PHOSPHATE 10 MG/ML IJ SOLN
INTRAMUSCULAR | Status: DC | PRN
  Administered 2019-11-14: 10 mg via INTRAVENOUS

## 2019-11-14 MED ORDER — ALVIMOPAN 12 MG PO CAPS
12.0000 mg | ORAL_CAPSULE | ORAL | Status: AC
  Administered 2019-11-14: 12 mg via ORAL
  Filled 2019-11-14: qty 1

## 2019-11-14 MED ORDER — HYDRALAZINE HCL 20 MG/ML IJ SOLN
10.0000 mg | INTRAMUSCULAR | Status: DC | PRN
  Administered 2019-11-14 (×2): 10 mg via INTRAVENOUS

## 2019-11-14 MED ORDER — MIDAZOLAM HCL 2 MG/2ML IJ SOLN
INTRAMUSCULAR | Status: AC
  Filled 2019-11-14: qty 2

## 2019-11-14 MED ORDER — FENTANYL CITRATE (PF) 100 MCG/2ML IJ SOLN
INTRAMUSCULAR | Status: AC
  Filled 2019-11-14: qty 2

## 2019-11-14 MED ORDER — SODIUM CHLORIDE 0.9 % IV SOLN
2.0000 g | INTRAVENOUS | Status: AC
  Administered 2019-11-14: 2 g via INTRAVENOUS
  Filled 2019-11-14: qty 2

## 2019-11-14 MED ORDER — ACETAMINOPHEN 500 MG PO TABS
1000.0000 mg | ORAL_TABLET | ORAL | Status: AC
  Administered 2019-11-14: 1000 mg via ORAL
  Filled 2019-11-14: qty 2

## 2019-11-14 SURGICAL SUPPLY — 80 items
APPLIER CLIP 5 13 M/L LIGAMAX5 (MISCELLANEOUS)
APPLIER CLIP ROT 10 11.4 M/L (STAPLE)
BENZOIN TINCTURE PRP APPL 2/3 (GAUZE/BANDAGES/DRESSINGS) IMPLANT
BLADE EXTENDED COATED 6.5IN (ELECTRODE) IMPLANT
BNDG ADH 1X3 SHEER STRL LF (GAUZE/BANDAGES/DRESSINGS) IMPLANT
CABLE HIGH FREQUENCY MONO STRZ (ELECTRODE) IMPLANT
CELLS DAT CNTRL 66122 CELL SVR (MISCELLANEOUS) IMPLANT
CHLORAPREP W/TINT 26 (MISCELLANEOUS) ×3 IMPLANT
CLIP APPLIE 5 13 M/L LIGAMAX5 (MISCELLANEOUS) IMPLANT
CLIP APPLIE ROT 10 11.4 M/L (STAPLE) IMPLANT
CLOSURE WOUND 1/2 X4 (GAUZE/BANDAGES/DRESSINGS)
COUNTER NEEDLE 20 DBL MAG RED (NEEDLE) IMPLANT
COVER MAYO STAND STRL (DRAPES) IMPLANT
COVER SURGICAL LIGHT HANDLE (MISCELLANEOUS) ×3 IMPLANT
COVER WAND RF STERILE (DRAPES) IMPLANT
DECANTER SPIKE VIAL GLASS SM (MISCELLANEOUS) IMPLANT
DRAIN CHANNEL 19F RND (DRAIN) IMPLANT
DRAPE LAPAROSCOPIC ABDOMINAL (DRAPES) IMPLANT
DRAPE UTILITY XL STRL (DRAPES) ×6 IMPLANT
DRSG OPSITE POSTOP 4X10 (GAUZE/BANDAGES/DRESSINGS) IMPLANT
DRSG OPSITE POSTOP 4X6 (GAUZE/BANDAGES/DRESSINGS) ×6 IMPLANT
DRSG OPSITE POSTOP 4X8 (GAUZE/BANDAGES/DRESSINGS) IMPLANT
ELECT REM PT RETURN 15FT ADLT (MISCELLANEOUS) ×3 IMPLANT
ENDOLOOP SUT PDS II  0 18 (SUTURE)
ENDOLOOP SUT PDS II 0 18 (SUTURE) IMPLANT
GAUZE SPONGE 4X4 12PLY STRL (GAUZE/BANDAGES/DRESSINGS) IMPLANT
GLOVE BIOGEL PI IND STRL 7.0 (GLOVE) ×2 IMPLANT
GLOVE BIOGEL PI INDICATOR 7.0 (GLOVE) ×4
GLOVE SURG SS PI 7.0 STRL IVOR (GLOVE) ×6 IMPLANT
GOWN STRL REUS W/TWL LRG LVL3 (GOWN DISPOSABLE) ×12 IMPLANT
GOWN STRL REUS W/TWL XL LVL3 (GOWN DISPOSABLE) ×12 IMPLANT
HANDLE SUCTION POOLE (INSTRUMENTS) IMPLANT
HEMOSTAT SNOW SURGICEL 2X4 (HEMOSTASIS) ×3 IMPLANT
IRRIG SUCT STRYKERFLOW 2 WTIP (MISCELLANEOUS) ×3
IRRIGATION SUCT STRKRFLW 2 WTP (MISCELLANEOUS) ×1 IMPLANT
KIT SIGMOIDOSCOPE (SET/KITS/TRAYS/PACK) ×3 IMPLANT
KIT TURNOVER KIT A (KITS) ×3 IMPLANT
LEGGING LITHOTOMY PAIR STRL (DRAPES) ×3 IMPLANT
PACK COLON (CUSTOM PROCEDURE TRAY) ×3 IMPLANT
PAD POSITIONING PINK XL (MISCELLANEOUS) ×3 IMPLANT
PENCIL SMOKE EVACUATOR (MISCELLANEOUS) IMPLANT
PORT LAP GEL ALEXIS MED 5-9CM (MISCELLANEOUS) ×3 IMPLANT
PROTECTOR NERVE ULNAR (MISCELLANEOUS) ×3 IMPLANT
RELOAD STAPLER BLUE 60MM (STAPLE) IMPLANT
RELOAD STAPLER GOLD 60MM (STAPLE) ×1 IMPLANT
RELOAD STAPLER WHITE 60MM (STAPLE) IMPLANT
RTRCTR WOUND ALEXIS 18CM MED (MISCELLANEOUS)
SCISSORS LAP 5X35 DISP (ENDOMECHANICALS) ×3 IMPLANT
SEALER TISSUE G2 STRG ARTC 35C (ENDOMECHANICALS) IMPLANT
SET TUBE SMOKE EVAC HIGH FLOW (TUBING) ×3 IMPLANT
SHEARS HARMONIC HDI 36CM (ELECTROSURGICAL) ×3 IMPLANT
SLEEVE XCEL OPT CAN 5 100 (ENDOMECHANICALS) ×9 IMPLANT
STAPLER ECHELON LONG 60 440 (INSTRUMENTS) ×3 IMPLANT
STAPLER RELOAD BLUE 60MM (STAPLE)
STAPLER RELOAD GOLD 60MM (STAPLE) ×3
STAPLER RELOAD WHITE 60MM (STAPLE)
STAPLER VISISTAT 35W (STAPLE) IMPLANT
STRIP CLOSURE SKIN 1/2X4 (GAUZE/BANDAGES/DRESSINGS) IMPLANT
SUCTION POOLE HANDLE (INSTRUMENTS)
SUT MNCRL AB 4-0 PS2 18 (SUTURE) ×3 IMPLANT
SUT PDS AB 0 CT1 36 (SUTURE) ×6 IMPLANT
SUT PROLENE 2 0 KS (SUTURE) IMPLANT
SUT SILK 2 0 (SUTURE) ×2
SUT SILK 2 0 SH CR/8 (SUTURE) ×3 IMPLANT
SUT SILK 2-0 18XBRD TIE 12 (SUTURE) ×1 IMPLANT
SUT SILK 3 0 (SUTURE) ×2
SUT SILK 3 0 SH CR/8 (SUTURE) ×3 IMPLANT
SUT SILK 3-0 18XBRD TIE 12 (SUTURE) ×1 IMPLANT
SUT VIC AB 2-0 SH 27 (SUTURE) ×2
SUT VIC AB 2-0 SH 27X BRD (SUTURE) ×1 IMPLANT
SUT VICRYL 0 ENDOLOOP (SUTURE) IMPLANT
SUT VICRYL 0 UR6 27IN ABS (SUTURE) ×3 IMPLANT
SYS LAPSCP GELPORT 120MM (MISCELLANEOUS)
SYSTEM LAPSCP GELPORT 120MM (MISCELLANEOUS) IMPLANT
TAPE CLOTH 4X10 WHT NS (GAUZE/BANDAGES/DRESSINGS) IMPLANT
TOWEL OR 17X26 10 PK STRL BLUE (TOWEL DISPOSABLE) IMPLANT
TOWEL OR NON WOVEN STRL DISP B (DISPOSABLE) ×3 IMPLANT
TRAY FOLEY MTR SLVR 16FR STAT (SET/KITS/TRAYS/PACK) ×3 IMPLANT
TROCAR BLADELESS OPT 5 100 (ENDOMECHANICALS) ×3 IMPLANT
TROCAR XCEL 12X100 BLDLESS (ENDOMECHANICALS) ×3 IMPLANT

## 2019-11-14 NOTE — Transfer of Care (Signed)
Immediate Anesthesia Transfer of Care Note  Patient: Molly Anderson  Procedure(s) Performed: Procedure(s): LAPAROSCOPIC PARTIAL COLECTOMY WITH ANASTOMOSIS WITH RIGID PROCTOSCOPY (N/A)  Patient Location: PACU  Anesthesia Type:General  Level of Consciousness:  sedated, patient cooperative and responds to stimulation  Airway & Oxygen Therapy:Patient Spontanous Breathing and Patient connected to face mask oxgen  Post-op Assessment:  Report given to PACU RN and Post -op Vital signs reviewed and stable  Post vital signs:  Reviewed and stable  Last Vitals:  Vitals:   11/14/19 0828  BP: (!) 154/87  Pulse: 62  Resp: 18  Temp: 36.7 C  SpO2: 100%    Complications: No apparent anesthesia complications

## 2019-11-14 NOTE — Op Note (Signed)
Preoperative diagnosis: diverticular stricture  Postoperative diagnosis: same   Procedure: laparoscopic partial colon resection with colorectal anastomosis, laparoscopic splenic flexure mobilization  Surgeon: Feliciana Rossetti, M.D.  Asst: Ovidio Kin, M.D.  Anesthesia: general  Indications for procedure: Molly Anderson is a 54 y.o. year old female with symptoms of pain and difficulty defecating. Marland Kitchen  Description of procedure: The patient was brought into the operative suite. Anesthesia was administered with General endotracheal anesthesia. WHO checklist was applied. The patient was then placed in lithotomy position. The area was prepped and draped in the usual sterile fashion.  Next, a small incision was made in the left subcostal area. A 71mm trocar was used to gain access to the peritoneal cavity by optical entry technique. Pneumoperitoneum was applied with a high flow and low pressure. The laparoscope was reinserted to confirm position.  3 additional trocars were placed; one 5 mm trocar in the right upper quadrant, 1 12 mm trocar in the right lower quadrant, one 5 mm trocar in the infraumbilical area through old scar. Right and left TAP blocks were placed with Marcaine/Exparel mix.  The sigmoid colon was adhered to the lateral abdominal wall. The colon was retracted and the mesocolon entered and dissected. THe ureter was identified. Further dissection of the mesocolon distally was performed. Next, the white line of Toldt was divided with harmonic scalpel up to the splenic flexure. There was dense adhesions of the area. Attention was next turned to the pelvic portion. The plane was continued to the pelvis and the left ovary and fallopian tube were adhered to the colon. These adhesions were taken down with harmonic scalpel and blunt dissection. Once complete the sigmoid was free and rectosigmoid junction identified. The mesocolon was dissected down to the junction. A gold load stapler was used  to divide the junction. Vessels of the sigmoid were divided.  The healthy left colon was identified and length was not sufficient. Therefore, the splenic flexure was completely mobilized by dissecting the omentum off the transverse colon and continue dissection laterally. This took 30 minutes to complete due to adhesions. There was some bare area of the spleen that appeared hemostatic but SNOW hemostatic agent was put in place for concern. Once fully mobilized the healthy colon easily reached the pelvis.  A vertical infraumbilical incision was made and cautery used to dissect through the subcutaneous tissue and fascia. A wound protector was put in place. The sigmoid colon was brought hole. A pursestring was created with 0 prolene and pursestring clamp. The colon was divided with cautery. The colon was sized to a 29 mm EEA and anvil was placed and secured.  Next, Dr. Ezzard Standing went below and dilated the anus and placed the stapler into the rectum to the junction and anastomosis was created. Leak test was performed with rigid proctoscopy and no bubbles were seen. The abdomen and pelvis were irrigated. The spleen was inspected and was hemostatic. Pneumoperitoneum was evacuated.  The peritoneum of the infraumbilical incision was closed with 0 vicryl. The fascia was closed with 0 PDS in running fashion. The remainder of local anesthesia was placed into the muscle of the wound. 4-0 monocryl was used to close and incision with subcuticular fashion. Bandage was placed. The patient awoke from anesthesia and brought to PACU in stable condition. All counts were correct.  Findings: thickening of sigmoid colon, dense adhesions of omentum and colon to spleen.  Specimen: colon   Implant: none   Blood loss: 50 ml  Local anesthesia: 50 ml  Exparel:Marcaine Mix  Complications: none  Feliciana Rossetti, M.D. General, Bariatric, & Minimally Invasive Surgery [facility]

## 2019-11-14 NOTE — Anesthesia Procedure Notes (Signed)
Procedure Name: Intubation Date/Time: 11/14/2019 9:48 AM Performed by: Lavina Hamman, CRNA Pre-anesthesia Checklist: Patient identified, Emergency Drugs available, Suction available, Patient being monitored and Timeout performed Patient Re-evaluated:Patient Re-evaluated prior to induction Oxygen Delivery Method: Circle system utilized Preoxygenation: Pre-oxygenation with 100% oxygen Induction Type: IV induction Ventilation: Mask ventilation without difficulty Laryngoscope Size: Mac and 3 Grade View: Grade I Tube type: Oral Tube size: 7.0 mm Number of attempts: 1 Airway Equipment and Method: Stylet Placement Confirmation: ETT inserted through vocal cords under direct vision,  positive ETCO2,  CO2 detector and breath sounds checked- equal and bilateral Secured at: 22 cm Tube secured with: Tape Dental Injury: Teeth and Oropharynx as per pre-operative assessment  Comments: EMS Student Phil intubated, easy mask, ATOI.

## 2019-11-14 NOTE — Anesthesia Postprocedure Evaluation (Signed)
Anesthesia Post Note  Patient: Molly Anderson  Procedure(s) Performed: LAPAROSCOPIC PARTIAL COLECTOMY WITH ANASTOMOSIS WITH RIGID PROCTOSCOPY (N/A )     Patient location during evaluation: PACU Anesthesia Type: General Level of consciousness: awake and alert Pain management: pain level controlled Vital Signs Assessment: post-procedure vital signs reviewed and stable Respiratory status: spontaneous breathing, nonlabored ventilation and respiratory function stable Cardiovascular status: blood pressure returned to baseline and stable Postop Assessment: no apparent nausea or vomiting Anesthetic complications: no   No complications documented.  Last Vitals:  Vitals:   11/14/19 1415 11/14/19 1449  BP: (!) 141/81 (!) 142/80  Pulse: 91 97  Resp: 17 14  Temp:  36.7 C  SpO2: 100% 100%    Last Pain:  Vitals:   11/14/19 1415  TempSrc:   PainSc: Asleep                 [name]

## 2019-11-14 NOTE — Anesthesia Preprocedure Evaluation (Addendum)
Anesthesia Evaluation  Patient identified by MRN, date of birth, ID band Patient awake    Reviewed: Allergy & Precautions, NPO status , Patient's Chart, lab work & pertinent test results  Airway Mallampati: II  TM Distance: >3 FB Neck ROM: Full    Dental no notable dental hx.    Pulmonary Current Smoker (discussed with patient, she plans to quit) and Patient abstained from smoking.,    Pulmonary exam normal + rhonchi        Cardiovascular hypertension, Normal cardiovascular exam Rhythm:Regular Rate:Normal  07-Nov-2019 Normal sinus rhythm Normal ECG   Neuro/Psych Depression negative neurological ROS     GI/Hepatic Neg liver ROS, diverticulitis   Endo/Other  Hyperthyroidism   Renal/GU negative Renal ROS  negative genitourinary   Musculoskeletal negative musculoskeletal ROS (+)   Abdominal (+)  Abdomen: tender.    Peds  Hematology negative hematology ROS (+)   Anesthesia Other Findings   Reproductive/Obstetrics                            Anesthesia Physical Anesthesia Plan  ASA: II  Anesthesia Plan: General   Post-op Pain Management:    Induction: Intravenous  PONV Risk Score and Plan: 3 and Ondansetron, Dexamethasone and Midazolam  Airway Management Planned: Oral ETT  Additional Equipment:   Intra-op Plan:   Post-operative Plan: Extubation in OR  Informed Consent: I have reviewed the patients History and Physical, chart, labs and discussed the procedure including the risks, benefits and alternatives for the proposed anesthesia with the patient or authorized representative who has indicated his/her understanding and acceptance.     Dental advisory given  Plan Discussed with: CRNA  Anesthesia Plan Comments:        Anesthesia Quick Evaluation

## 2019-11-14 NOTE — H&P (Signed)
Molly Anderson is an 54 y.o. female.   Chief Complaint: stricture HPI: 54 yo female with previous diverticulitis requiring admission. She has had increased symptoms and thin stools. On work up she was found to have a sigmoid stricture likely from diverticular disease.  Past Medical History:  Diagnosis Date  . Depression   . Hypertension   . Hyperthyroidism   . MI (myocardial infarction) (HCC)    light 2008 no longer sees cardiologist saw for 1 year  . Thyroid disease     Past Surgical History:  Procedure Laterality Date  . ECTOPIC PREGNANCY SURGERY Left     Family History  Problem Relation Age of Onset  . Diabetes Neg Hx   . Hypertension Neg Hx    Social History:  reports that she quit smoking about 7 months ago. Her smoking use included cigarettes. She has never used smokeless tobacco. She reports previous alcohol use. She reports that she does not use drugs.  Allergies:  Allergies  Allergen Reactions  . Morphine And Related     Hives around IV site    Medications Prior to Admission  Medication Sig Dispense Refill  . lisinopril (ZESTRIL) 20 MG tablet Take 20 mg by mouth daily.     . ondansetron (ZOFRAN) 4 MG tablet Take 1 tablet (4 mg total) by mouth every 6 (six) hours as needed for nausea. 20 tablet 0  . sertraline (ZOLOFT) 25 MG tablet Take 25 mg by mouth daily.    Marland Kitchen HYDROcodone-acetaminophen (NORCO/VICODIN) 5-325 MG tablet Take 1-2 tablets by mouth every 4 (four) hours as needed for moderate pain. (Patient taking differently: Take 1 tablet by mouth at bedtime as needed for moderate pain (sleep). ) 15 tablet 0    No results found for this or any previous visit (from the past 48 hour(s)). No results found.  Review of Systems  Constitutional: Negative for chills and fever.  HENT: Negative for hearing loss.   Respiratory: Negative for cough.   Cardiovascular: Negative for chest pain and palpitations.  Gastrointestinal: Negative for abdominal pain, nausea and  vomiting.  Genitourinary: Negative for dysuria and urgency.  Musculoskeletal: Negative for myalgias and neck pain.  Skin: Negative for rash.  Neurological: Negative for dizziness and headaches.  Hematological: Does not bruise/bleed easily.  Psychiatric/Behavioral: Negative for suicidal ideas.    Blood pressure (!) 154/87, pulse 62, temperature 98 F (36.7 C), temperature source Oral, resp. rate 18, height 5\' 3"  (1.6 m), weight 60.9 kg, SpO2 100 %. Physical Exam Vitals reviewed.  Constitutional:      Appearance: She is well-developed.  HENT:     Head: Normocephalic and atraumatic.  Eyes:     Conjunctiva/sclera: Conjunctivae normal.     Pupils: Pupils are equal, round, and reactive to light.  Cardiovascular:     Rate and Rhythm: Normal rate and regular rhythm.  Pulmonary:     Effort: Pulmonary effort is normal.     Breath sounds: Normal breath sounds.  Abdominal:     General: Bowel sounds are normal. There is no distension.     Palpations: Abdomen is soft.     Tenderness: There is no abdominal tenderness.  Musculoskeletal:        General: Normal range of motion.     Cervical back: Normal range of motion and neck supple.  Skin:    General: Skin is warm and dry.  Neurological:     Mental Status: She is alert and oriented to person, place, and time.  Psychiatric:  Behavior: Behavior normal.      Assessment/Plan 54 yo female with sigmoid stricture from diverticulitis -lap sigmoid resection with anastomosis, possible diverting ostomy -inpatient admission  Rodman Pickle, MD 11/14/2019, 9:34 AM

## 2019-11-15 ENCOUNTER — Encounter (HOSPITAL_COMMUNITY): Payer: Self-pay | Admitting: General Surgery

## 2019-11-15 LAB — CBC
HCT: 33.4 % — ABNORMAL LOW (ref 36.0–46.0)
Hemoglobin: 11.3 g/dL — ABNORMAL LOW (ref 12.0–15.0)
MCH: 29.9 pg (ref 26.0–34.0)
MCHC: 33.8 g/dL (ref 30.0–36.0)
MCV: 88.4 fL (ref 80.0–100.0)
Platelets: 242 10*3/uL (ref 150–400)
RBC: 3.78 MIL/uL — ABNORMAL LOW (ref 3.87–5.11)
RDW: 14.5 % (ref 11.5–15.5)
WBC: 7.1 10*3/uL (ref 4.0–10.5)
nRBC: 0 % (ref 0.0–0.2)

## 2019-11-15 LAB — BASIC METABOLIC PANEL
Anion gap: 7 (ref 5–15)
BUN: 8 mg/dL (ref 6–20)
CO2: 26 mmol/L (ref 22–32)
Calcium: 8.3 mg/dL — ABNORMAL LOW (ref 8.9–10.3)
Chloride: 106 mmol/L (ref 98–111)
Creatinine, Ser: 0.64 mg/dL (ref 0.44–1.00)
GFR calc Af Amer: >60 mL/min (ref >60)
GFR calc non Af Amer: >60 mL/min (ref >60)
Glucose, Bld: 109 mg/dL — ABNORMAL HIGH (ref 70–99)
Potassium: 3.9 mmol/L (ref 3.5–5.1)
Sodium: 139 mmol/L (ref 135–145)

## 2019-11-15 LAB — SURGICAL PATHOLOGY

## 2019-11-15 NOTE — Evaluation (Signed)
Physical Therapy One Time Evaluation Patient Details Name: Molly Anderson MRN: [id number] DOB: 1965-11-25 Today's Date: 11/15/2019   History of Present Illness  Pt is a 54 year old female s/p LAPAROSCOPIC PARTIAL COLECTOMY WITH ANASTOMOSIS WITH RIGID PROCTOSCOPY 11/14/2019  Clinical Impression  Patient evaluated by Physical Therapy with no further acute PT needs identified. All education has been completed and the patient has no further questions.  Pt ambulated in hallway and not requiring assist or assistive device.  Pt encouraged to ambulate 4x/day.  Pt agreeable to no further PT needs at this time. See below for any follow-up Physical Therapy or equipment needs. PT is signing off. Thank you for this referral.     Follow Up Recommendations No PT follow up    Equipment Recommendations  None recommended by PT    Recommendations for Other Services       Precautions / Restrictions Precautions Precautions: None      Mobility  Bed Mobility               General bed mobility comments: pt in recliner  Transfers Overall transfer level: Modified independent                  Ambulation/Gait Ambulation/Gait assistance: Supervision;Modified independent (Device/Increase time) Gait Distance (Feet): 400 Feet Assistive device: None Gait Pattern/deviations: Step-through pattern;Decreased stride length     General Gait Details: pt denies symptoms, no unsteadiness or LOB observed  Stairs            Wheelchair Mobility    Modified Rankin (Stroke Patients Only)       Balance Overall balance assessment: No apparent balance deficits (not formally assessed)                                           Pertinent Vitals/Pain Pain Assessment: No/denies pain    Home Living Family/patient expects to be discharged to:: Private residence Living Arrangements: Children;Other relatives Available Help at Discharge: Family;Available 24 hours/day Type  of Home: House       Home Layout: Able to live on main level with bedroom/bathroom Home Equipment: None      Prior Function Level of Independence: Independent               Hand Dominance        Extremity/Trunk Assessment   Upper Extremity Assessment Upper Extremity Assessment: Overall WFL for tasks assessed    Lower Extremity Assessment Lower Extremity Assessment: Overall WFL for tasks assessed    Cervical / Trunk Assessment Cervical / Trunk Assessment: Normal  Communication   Communication: No difficulties  Cognition Arousal/Alertness: Awake/alert Behavior During Therapy: WFL for tasks assessed/performed Overall Cognitive Status: Within Functional Limits for tasks assessed                                        General Comments      Exercises     Assessment/Plan    PT Assessment Patent does not need any further PT services  PT Problem List         PT Treatment Interventions      PT Goals (Current goals can be found in the Care Plan section)  Acute Rehab PT Goals PT Goal Formulation: All assessment and education complete, DC therapy  Frequency     Barriers to discharge        Co-evaluation               AM-PAC PT "6 Clicks" Mobility  Outcome Measure Help needed turning from your back to your side while in a flat bed without using bedrails?: None Help needed moving from lying on your back to sitting on the side of a flat bed without using bedrails?: None Help needed moving to and from a bed to a chair (including a wheelchair)?: None Help needed standing up from a chair using your arms (e.g., wheelchair or bedside chair)?: None Help needed to walk in hospital room?: None Help needed climbing 3-5 steps with a railing? : None 6 Click Score: 24    End of Session   Activity Tolerance: Patient tolerated treatment well Patient left: in chair;with call bell/phone within reach Nurse Communication: Mobility status PT  Visit Diagnosis: Difficulty in walking, not elsewhere classified (R26.2)    Time: 8938-1017 PT Time Calculation (min) (ACUTE ONLY): 9 min   Charges:   PT Evaluation $PT Eval Low Complexity: 1 Low        Kati PT, DPT Acute Rehabilitation Services Pager: [phone] Office: [phone]  Maida Sale E 11/15/2019, 12:11 PM

## 2019-11-15 NOTE — Progress Notes (Signed)
Progress Note: General Surgery Service   Chief Complaint/Subjective: Some soreness, tolerating liquids, no flatus, lightheaded on standing this morning  Objective: Vital signs in last 24 hours: Temp:  [97.5 F (36.4 C)-98.3 F (36.8 C)] 97.9 F (36.6 C) (08/17 0534) Pulse Rate:  [62-108] 80 (08/17 0534) Resp:  [14-20] 19 (08/17 0534) BP: (120-228)/(79-126) 120/82 (08/17 0534) SpO2:  [100 %] 100 % (08/17 0534) Weight:  [60.9 kg-63.1 kg] 63.1 kg (08/17 0539)    Intake/Output from previous day: 08/16 0701 - 08/17 0700 In: 2562.5 [P.O.:150; I.V.:2412.5] Out: 2350 [Urine:2250; Blood:100] Intake/Output this shift: No intake/output data recorded.  Gen: NAD  Resp: nonlabored  Card: RRR  Abd: soft, ATTP, incisions c/d/i  Lab Results: CBC  Recent Labs    11/14/19 1517 11/15/19 0520  WBC 12.1* 7.1  HGB 13.7 11.3*  HCT 39.9 33.4*  PLT PLATELET CLUMPS NOTED ON SMEAR, UNABLE TO ESTIMATE 242   BMET Recent Labs    11/14/19 1517 11/15/19 0520  NA  --  139  K  --  3.9  CL  --  106  CO2  --  26  GLUCOSE  --  109*  BUN  --  8  CREATININE 0.75 0.64  CALCIUM  --  8.3*   PT/INR No results for input(s): LABPROT, INR in the last 72 hours. ABG No results for input(s): PHART, HCO3 in the last 72 hours.  Invalid input(s): PCO2, PO2  Anti-infectives: Anti-infectives (From admission, onward)   Start     Dose/Rate Route Frequency Ordered Stop   11/14/19 1400  neomycin (MYCIFRADIN) tablet 1,000 mg  Status:  Discontinued       "And" Linked Group Details   1,000 mg Oral 3 times per day 11/14/19 0805 11/14/19 0809   11/14/19 1400  metroNIDAZOLE (FLAGYL) tablet 1,000 mg  Status:  Discontinued       "And" Linked Group Details   1,000 mg Oral 3 times per day 11/14/19 0805 11/14/19 0809   11/14/19 0815  cefoTEtan (CEFOTAN) 2 g in sodium chloride 0.9 % 100 mL IVPB        2 g 200 mL/hr over 30 Minutes Intravenous On call to O.R. 11/14/19 0805 11/14/19 0933   11/14/19 0815   clindamycin (CLEOCIN) 900 mg, gentamicin (GARAMYCIN) 240 mg in sodium chloride 0.9 % 1,000 mL for intraperitoneal lavage  Status:  Discontinued         Irrigation To Surgery 11/14/19 0805 11/14/19 1452      Medications: Scheduled Meds: . alvimopan  12 mg Oral BID  . enoxaparin (LOVENOX) injection  40 mg Subcutaneous Q24H  . feeding supplement  237 mL Oral BID BM  . gabapentin  300 mg Oral BID  . ketorolac  30 mg Intravenous Q6H  . sertraline  25 mg Oral Daily   Continuous Infusions: . sodium chloride 75 mL/hr at 11/15/19 0514   PRN Meds:.acetaminophen, diphenhydrAMINE **OR** diphenhydrAMINE, HYDROmorphone (DILAUDID) injection, metoprolol tartrate, ondansetron **OR** ondansetron (ZOFRAN) IV, oxyCODONE, simethicone  Assessment/Plan: s/p Procedure(s): LAPAROSCOPIC PARTIAL COLECTOMY WITH ANASTOMOSIS WITH RIGID PROCTOSCOPY 11/14/2019  -full liquids -remove foley -ambulate -pain control   LOS: 1 day   Rodman Pickle, MD [phone] [facility]

## 2019-11-16 MED ORDER — OXYCODONE HCL 5 MG PO TABS
5.0000 mg | ORAL_TABLET | ORAL | Status: DC | PRN
  Administered 2019-11-16 – 2019-11-17 (×6): 5 mg via ORAL
  Filled 2019-11-16 (×5): qty 1

## 2019-11-16 NOTE — Progress Notes (Signed)
Progress Note: General Surgery Service   Chief Complaint/Subjective: Pain moderate, walked well yesterday, +BM overnight, tolerating liquids  Objective: Vital signs in last 24 hours: Temp:  [98.3 F (36.8 C)-99 F (37.2 C)] 98.6 F (37 C) (08/18 0551) Pulse Rate:  [64-80] 64 (08/18 0551) Resp:  [16-18] 16 (08/18 0551) BP: (120-151)/(82-98) 151/98 (08/18 0551) SpO2:  [100 %] 100 % (08/18 0551) Weight:  [64.6 kg] 64.6 kg (08/18 0551) Last BM Date: 11/15/19  Intake/Output from previous day: 08/17 0701 - 08/18 0700 In: 2112.4 [P.O.:1450; I.V.:662.4] Out: -  Intake/Output this shift: No intake/output data recorded.  Gen: NAD  Resp: nonlabored  Card: RRR  Abd: soft, ATTP, incisions c/d/i, nondistended  Lab Results: CBC  Recent Labs    11/14/19 1517 11/15/19 0520  WBC 12.1* 7.1  HGB 13.7 11.3*  HCT 39.9 33.4*  PLT PLATELET CLUMPS NOTED ON SMEAR, UNABLE TO ESTIMATE 242   BMET Recent Labs    11/14/19 1517 11/15/19 0520  NA  --  139  K  --  3.9  CL  --  106  CO2  --  26  GLUCOSE  --  109*  BUN  --  8  CREATININE 0.75 0.64  CALCIUM  --  8.3*   PT/INR No results for input(s): LABPROT, INR in the last 72 hours. ABG No results for input(s): PHART, HCO3 in the last 72 hours.  Invalid input(s): PCO2, PO2  Anti-infectives: Anti-infectives (From admission, onward)   Start     Dose/Rate Route Frequency Ordered Stop   11/14/19 1400  neomycin (MYCIFRADIN) tablet 1,000 mg  Status:  Discontinued       "And" Linked Group Details   1,000 mg Oral 3 times per day 11/14/19 0805 11/14/19 0809   11/14/19 1400  metroNIDAZOLE (FLAGYL) tablet 1,000 mg  Status:  Discontinued       "And" Linked Group Details   1,000 mg Oral 3 times per day 11/14/19 0805 11/14/19 0809   11/14/19 0815  cefoTEtan (CEFOTAN) 2 g in sodium chloride 0.9 % 100 mL IVPB        2 g 200 mL/hr over 30 Minutes Intravenous On call to O.R. 11/14/19 0805 11/14/19 0933   11/14/19 0815  clindamycin (CLEOCIN)  900 mg, gentamicin (GARAMYCIN) 240 mg in sodium chloride 0.9 % 1,000 mL for intraperitoneal lavage  Status:  Discontinued         Irrigation To Surgery 11/14/19 0805 11/14/19 1452      Medications: Scheduled Meds: . alvimopan  12 mg Oral BID  . enoxaparin (LOVENOX) injection  40 mg Subcutaneous Q24H  . feeding supplement  237 mL Oral BID BM  . gabapentin  300 mg Oral BID  . ketorolac  30 mg Intravenous Q6H  . sertraline  25 mg Oral Daily   Continuous Infusions: PRN Meds:.acetaminophen, diphenhydrAMINE **OR** diphenhydrAMINE, HYDROmorphone (DILAUDID) injection, metoprolol tartrate, ondansetron **OR** ondansetron (ZOFRAN) IV, oxyCODONE, simethicone  Assessment/Plan: s/p Procedure(s): LAPAROSCOPIC PARTIAL COLECTOMY WITH ANASTOMOSIS WITH RIGID PROCTOSCOPY 11/14/2019  -advance diet -continue pain control and ambulation   LOS: 2 days   Rodman Pickle, MD [phone] [facility]

## 2019-11-17 LAB — CBC
HCT: 29.7 % — ABNORMAL LOW (ref 36.0–46.0)
Hemoglobin: 10 g/dL — ABNORMAL LOW (ref 12.0–15.0)
MCH: 30 pg (ref 26.0–34.0)
MCHC: 33.7 g/dL (ref 30.0–36.0)
MCV: 89.2 fL (ref 80.0–100.0)
Platelets: 190 10*3/uL (ref 150–400)
RBC: 3.33 MIL/uL — ABNORMAL LOW (ref 3.87–5.11)
RDW: 14.2 % (ref 11.5–15.5)
WBC: 3.9 10*3/uL — ABNORMAL LOW (ref 4.0–10.5)
nRBC: 0 % (ref 0.0–0.2)

## 2019-11-17 LAB — BASIC METABOLIC PANEL
Anion gap: 5 (ref 5–15)
BUN: 8 mg/dL (ref 6–20)
CO2: 27 mmol/L (ref 22–32)
Calcium: 8.2 mg/dL — ABNORMAL LOW (ref 8.9–10.3)
Chloride: 108 mmol/L (ref 98–111)
Creatinine, Ser: 0.63 mg/dL (ref 0.44–1.00)
GFR calc Af Amer: >60 mL/min (ref >60)
GFR calc non Af Amer: >60 mL/min (ref >60)
Glucose, Bld: 96 mg/dL (ref 70–99)
Potassium: 3.6 mmol/L (ref 3.5–5.1)
Sodium: 140 mmol/L (ref 135–145)

## 2019-11-17 MED ORDER — OXYCODONE HCL 5 MG PO TABS: 5.0000 mg | ORAL_TABLET | Freq: Four times a day (QID) | ORAL | 0 refills | Status: AC | PRN

## 2019-11-17 MED ORDER — IBUPROFEN 800 MG PO TABS
800.0000 mg | ORAL_TABLET | Freq: Three times a day (TID) | ORAL | 0 refills | Status: AC | PRN
Start: 2019-11-17 — End: ?

## 2019-11-17 MED FILL — IBUPROFEN 800 MG TAB: 800 | 10 days supply | Qty: 30 | Fill #0

## 2019-11-17 MED FILL — oxyCODONE HCL 5 MG TABS: 5 | 5 days supply | Qty: 20 | Fill #0

## 2019-11-17 NOTE — Discharge Instructions (Signed)
SURGERY: POST OP INSTRUCTIONS (Surgery for small bowel obstruction, colon resection, etc)   ######################################################################  EAT Gradually transition to a high fiber diet with a fiber supplement over the next few days after discharge  WALK Walk an hour a day.  Control your pain to do that.    CONTROL PAIN Control pain so that you can walk, sleep, tolerate sneezing/coughing, go up/down stairs.  HAVE A BOWEL MOVEMENT DAILY Keep your bowels regular to avoid problems.  OK to try a laxative to override constipation.  OK to use an antidairrheal to slow down diarrhea.  Call if not better after 2 tries  CALL IF YOU HAVE PROBLEMS/CONCERNS Call if you are still struggling despite following these instructions. Call if you have concerns not answered by these instructions  ######################################################################   DIET Follow a light diet the first few days at home.  Start with a bland diet such as soups, liquids, starchy foods, low fat foods, etc.  If you feel full, bloated, or constipated, stay on a ful liquid or pureed/blenderized diet for a few days until you feel better and no longer constipated. Be sure to drink plenty of fluids every day to avoid getting dehydrated (feeling dizzy, not urinating, etc.). Gradually add a fiber supplement to your diet over the next week.  Gradually get back to a regular solid diet.  Avoid fast food or heavy meals the first week as you are more likely to get nauseated. It is expected for your digestive tract to need a few months to get back to normal.  It is common for your bowel movements and stools to be irregular.  You will have occasional bloating and cramping that should eventually fade away.  Until you are eating solid food normally, off all pain medications, and back to regular activities; your bowels will not be normal. Focus on eating a low-fat, high fiber diet the rest of your life  (See Getting to Good Bowel Health, below).  CARE of your INCISION or WOUND It is good for closed incision and even open wounds to be washed every day.  Shower every day.  Short baths are fine.  Wash the incisions and wounds clean with soap & water.    If you have a closed incision(s), wash the incision with soap & water every day.  You may leave closed incisions open to air if it is dry.   You may cover the incision with clean gauze & replace it after your daily shower for comfort. If you have skin tapes (Steristrips) or skin glue (Dermabond) on your incision, leave them in place.  They will fall off on their own like a scab.  You may trim any edges that curl up with clean scissors.  If you have staples, set up an appointment for them to be removed in the office in 10 days after surgery.  If you have a drain, wash around the skin exit site with soap & water and place a new dressing of gauze or band aid around the skin every day.  Keep the drain site clean & dry.    If you have an open wound with packing, see wound care instructions.  In general, it is encouraged that you remove your dressing and packing, shower with soap & water, and replace your dressing once a day.  Pack the wound with clean gauze moistened with normal (0.9%) saline to keep the wound moist & uninfected.  Pressure on the dressing for 30 minutes will stop most wound   bleeding.  Eventually your body will heal & pull the open wound closed over the next few months.  Raw open wounds will occasionally bleed or secrete yellow drainage until it heals closed.  Drain sites will drain a little until the drain is removed.  Even closed incisions can have mild bleeding or drainage the first few days until the skin edges scab over & seal.   If you have an open wound with a wound vac, see wound vac care instructions.     ACTIVITIES as tolerated Start light daily activities --- self-care, walking, climbing stairs-- beginning the day after surgery.   Gradually increase activities as tolerated.  Control your pain to be active.  Stop when you are tired.  Ideally, walk several times a day, eventually an hour a day.   Most people are back to most day-to-day activities in a few weeks.  It takes 4-8 weeks to get back to unrestricted, intense activity. If you can walk 30 minutes without difficulty, it is safe to try more intense activity such as jogging, treadmill, bicycling, low-impact aerobics, swimming, etc. Save the most intensive and strenuous activity for last (Usually 4-8 weeks after surgery) such as sit-ups, heavy lifting, contact sports, etc.  Refrain from any intense heavy lifting or straining until you are off narcotics for pain control.  You will have off days, but things should improve week-by-week. DO NOT PUSH THROUGH PAIN.  Let pain be your guide: If it hurts to do something, don't do it.  Pain is your body warning you to avoid that activity for another week until the pain goes down. You may drive when you are no longer taking narcotic prescription pain medication, you can comfortably wear a seatbelt, and you can safely make sudden turns/stops to protect yourself without hesitating due to pain. You may have sexual intercourse when it is comfortable. If it hurts to do something, stop.  MEDICATIONS Take your usually prescribed home medications unless otherwise directed.     PAIN CONTROL Pain after surgery or related to activity is often due to strain/injury to muscle, tendon, nerves and/or incisions.  This pain is usually short-term and will improve in a few months.  To help speed the process of healing and to get back to regular activity more quickly, DO THE FOLLOWING THINGS TOGETHER: 1. Increase activity gradually.  DO NOT PUSH THROUGH PAIN 2. Use Ice and/or Heat 3. Try Gentle Massage and/or Stretching 4. Take over the counter pain medication 5. Take Narcotic prescription pain medication for more severe pain  Good pain control =  faster recovery.  It is better to take more medicine to be more active than to stay in bed all day to avoid medications. 1.  Increase activity gradually Avoid heavy lifting at first, then increase to lifting as tolerated over the next 6 weeks. Do not "push through" the pain.  Listen to your body and avoid positions and maneuvers than reproduce the pain.  Wait a few days before trying something more intense Walking an hour a day is encouraged to help your body recover faster and more safely.  Start slowly and stop when getting sore.  If you can walk 30 minutes without stopping or pain, you can try more intense activity (running, jogging, aerobics, cycling, swimming, treadmill, sex, sports, weightlifting, etc.) Remember: If it hurts to do it, then don't do it! 2. Use Ice and/or Heat You will have swelling and bruising around the incisions.  This will take several weeks to resolve.  Ice packs or heating pads (6-8 times a day, 30-60 minutes at a time) will help sooth soreness & bruising. Some people prefer to use ice alone, heat alone, or alternate between ice & heat.  Experiment and see what works best for you.  Consider trying ice for the first few days to help decrease swelling and bruising; then, switch to heat to help relax sore spots and speed recovery. Shower every day.  Short baths are fine.  It feels good!  Keep the incisions and wounds clean with soap & water.   3. Try Gentle Massage and/or Stretching Massage at the area of pain many times a day Stop if you feel pain - do not overdo it 4. Take over the counter pain medication This helps the muscle and nerve tissues become less irritable and calm down faster Choose ONE of the following over-the-counter anti-inflammatory medications: Acetaminophen 500mg  tabs (Tylenol) 1-2 pills with every meal and just before bedtime (avoid if you have liver problems or if you have acetaminophen in you narcotic prescription) Naproxen 220mg  tabs (ex. Aleve,  Naprosyn) 1-2 pills twice a day (avoid if you have kidney, stomach, IBD, or bleeding problems) Ibuprofen 200mg  tabs (ex. Advil, Motrin) 3-4 pills with every meal and just before bedtime (avoid if you have kidney, stomach, IBD, or bleeding problems) Take with food/snack several times a day as directed for at least 2 weeks to help keep pain / soreness down & more manageable. 5. Take Narcotic prescription pain medication for more severe pain A prescription for strong pain control is often given to you upon discharge (for example: oxycodone/Percocet, hydrocodone/Norco/Vicodin, or tramadol/Ultram) Take your pain medication as prescribed. Be mindful that most narcotic prescriptions contain Tylenol (acetaminophen) as well - avoid taking too much Tylenol. If you are having problems/concerns with the prescription medicine (does not control pain, nausea, vomiting, rash, itching, etc.), please call [phone] to see if we need to switch you to a different pain medicine that will work better for you and/or control your side effects better. If you need a refill on your pain medication, you must call the office before 4 pm and on weekdays only.  By federal law, prescriptions for narcotics cannot be called into a pharmacy.  They must be filled out on paper & picked up from our office by the patient or authorized caretaker.  Prescriptions cannot be filled after 4 pm nor on weekends.    WHEN TO CALL [phone] Severe uncontrolled or worsening pain  Fever over 101 F (38.5 C) Concerns with the incision: Worsening pain, redness, rash/hives, swelling, bleeding, or drainage Reactions / problems with new medications (itching, rash, hives, nausea, etc.) Nausea and/or vomiting Difficulty urinating Difficulty breathing Worsening fatigue, dizziness, lightheadedness, blurred vision Other concerns If you are not getting better after two weeks or are noticing you are getting worse, contact our office [phone]  [phone] for further advice.  We may need to adjust your medications, re-evaluate you in the office, send you to the emergency room, or see what other things we can do to help. The clinic staff is available to answer your questions during regular business hours (8:30am-5pm).  Please don't hesitate to call and ask to speak to one of our nurses for clinical concerns.    A surgeon from [facility] is always on call at the hospitals 24 hours/day If you have a medical emergency, go to the nearest emergency room or call 911.  FOLLOW UP in our office One  the day of your discharge from the hospital (or the next business weekday), please call [facility] to set up or confirm an appointment to see your surgeon in the office for a follow-up appointment.  Usually it is 2-3 weeks after your surgery.   If you have skin staples at your incision(s), let the office know so we can set up a time in the office for the nurse to remove them (usually around 10 days after surgery). Make sure that you call for appointments the day of discharge (or the next business weekday) from the hospital to ensure a convenient appointment time. IF YOU HAVE DISABILITY OR FAMILY LEAVE FORMS, BRING THEM TO THE OFFICE FOR PROCESSING.  DO NOT GIVE THEM TO YOUR DOCTOR.  [facility] [address], [place], [place]  [address] ? [phone] - Main [phone] - Toll Free,  [phone] - Fax [email or web address]  GETTING TO GOOD BOWEL HEALTH. It is expected for your digestive tract to need a few months to get back to normal.  It is common for your bowel movements and stools to be irregular.  You will have occasional bloating and cramping that should eventually fade away.  Until you are eating solid food normally, off all pain medications, and back to regular activities; your bowels will not be normal.   Avoiding constipation The goal: ONE SOFT BOWEL MOVEMENT A DAY!     Drink plenty of fluids.  Choose water first. TAKE A FIBER SUPPLEMENT EVERY DAY THE REST OF YOUR LIFE During your first week back home, gradually add back a fiber supplement every day Experiment which form you can tolerate.   There are many forms such as powders, tablets, wafers, gummies, etc Psyllium bran (Metamucil), methylcellulose (Citrucel), Miralax or Glycolax, Benefiber, Flax Seed.  Adjust the dose week-by-week (1/2 dose/day to 6 doses a day) until you are moving your bowels 1-2 times a day.  Cut back the dose or try a different fiber product if it is giving you problems such as diarrhea or bloating. Sometimes a laxative is needed to help jump-start bowels if constipated until the fiber supplement can help regulate your bowels.  If you are tolerating eating & you are farting, it is okay to try a gentle laxative such as double dose MiraLax, prune juice, or Milk of Magnesia.  Avoid using laxatives too often. Stool softeners can sometimes help counteract the constipating effects of narcotic pain medicines.  It can also cause diarrhea, so avoid using for too long. If you are still constipated despite taking fiber daily, eating solids, and a few doses of laxatives, call our office. Controlling diarrhea Try drinking liquids and eating bland foods for a few days to avoid stressing your intestines further. Avoid dairy products (especially milk & ice cream) for a short time.  The intestines often can lose the ability to digest lactose when stressed. Avoid foods that cause gassiness or bloating.  Typical foods include beans and other legumes, cabbage, broccoli, and dairy foods.  Avoid greasy, spicy, fast foods.  Every person has some sensitivity to other foods, so listen to your body and avoid those foods that trigger problems for you. Probiotics (such as active yogurt, Align, etc) may help repopulate the intestines and colon with normal bacteria and calm down a sensitive digestive tract Adding a fiber  supplement gradually can help thicken stools by absorbing excess fluid and retrain the intestines to act more normally.  Slowly increase the dose over a  few weeks.  Too much fiber too soon can backfire and cause cramping & bloating. It is okay to try and slow down diarrhea with a few doses of antidiarrheal medicines.   Bismuth subsalicylate (ex. Kayopectate, Pepto Bismol) for a few doses can help control diarrhea.  Avoid if pregnant.   Loperamide (Imodium) can slow down diarrhea.  Start with one tablet (2mg ) first.  Avoid if you are having fevers or severe pain.   TROUBLESHOOTING IRREGULAR BOWELS 1) Start with a soft & bland diet. No spicy, greasy, or fried foods.  2) Avoid gluten/wheat or dairy products from diet to see if symptoms improve. 3) Miralax 17gm or flax seed mixed in 8oz. water or juice-daily. May use 2-4 times a day as needed. 4) Gas-X, Phazyme, etc. as needed for gas & bloating.  5) Prilosec (omeprazole) over-the-counter as needed 6)  Consider probiotics (Align, Activa, etc) to help calm the bowels down  Call your doctor if you are getting worse or not getting better.  Sometimes further testing (cultures, endoscopy, X-ray studies, CT scans, bloodwork, etc.) may be needed to help diagnose and treat the cause of the diarrhea. [facility] [address], [address], [place], [place]  [id number] [phone] - Main.    [phone]  - Toll Free.   [phone] - Fax [email or web address]

## 2019-11-17 NOTE — Progress Notes (Signed)
Assessment unchanged. Pt verbalized understanding of dc instructions through teach back including meds and follow up care. Discharged via wc to front entrance accompanied by nurse.

## 2019-11-17 NOTE — Discharge Summary (Signed)
Physician Discharge Summary  Molly Anderson VOZ:[id number] DOB: Jun 11, 1965 DOA: 11/14/2019  PCP: Ashok Norris, MD  Admit date: 11/14/2019 Discharge date: 11/17/2019  Recommendations for Outpatient Follow-up:  1.  (include homehealth, outpatient follow-up instructions, specific recommendations for PCP to follow-up on, etc.)   Follow-up Information    [name], De Blanch, MD Follow up in 3 week(s).   Specialty: General Surgery Contact information: [address] 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 She underwent lap colon resection with anastomosis. Post op she was admitted to the surgical floor. Diet was slowly increased. She had return of bowel function POD 2. She was discharged home POD 3.  Discharge Instructions  Discharge Instructions    Call MD for:  difficulty breathing, headache or visual disturbances   Complete by: As directed    Call MD for:  persistant nausea and vomiting   Complete by: As directed    Call MD for:  redness, tenderness, or signs of infection (pain, swelling, redness, odor or green/yellow discharge around incision site)   Complete by: As directed    Call MD for:  severe uncontrolled pain   Complete by: As directed    Call MD for:  temperature >100.4   Complete by: As directed    Diet - low sodium heart healthy   Complete by: As directed    Discharge wound care:   Complete by: As directed    Remove honeycomb bandage in 5 days. Ok to shower  Steristrips will likely peel off in 1-3 weeks   Increase activity slowly   Complete by: As directed    Lifting restrictions   Complete by: As directed    Do not lift more than 20 pounds for 3-4 weeks     Allergies  as of 11/17/2019      Reactions   Morphine And Related    Hives around IV site      Medication List    STOP taking these medications   HYDROcodone-acetaminophen 5-325 MG tablet Commonly known as: NORCO/VICODIN   ondansetron 4 MG tablet Commonly known as: ZOFRAN     TAKE these medications   ibuprofen 800 MG tablet Commonly known as: ADVIL Take 1 tablet (800 mg total) by mouth every 8 (eight) hours as needed.   lisinopril 20 MG tablet Commonly known as: ZESTRIL Take 20 mg by mouth daily.   oxyCODONE 5 MG immediate release tablet Commonly known as: Oxy IR/ROXICODONE Take 1 tablet (5 mg total) by mouth every 6 (six) hours as needed for severe pain.   sertraline 25 MG tablet Commonly known as: ZOLOFT Take 25 mg by mouth daily.            Discharge Care Instructions  (From admission, onward)         Start     Ordered   11/17/19 0000  Discharge wound care:       Comments: Remove honeycomb bandage in 5 days. Ok to shower  Steristrips will likely peel off in 1-3 weeks   11/17/19 9518          Follow-up Information    [name], De Blanch, MD Follow up in 3 week(s).   Specialty: General  Surgery Contact information: [address] [place] [address] [phone]                The results of significant diagnostics from this hospitalization (including imaging, microbiology, ancillary and laboratory) are listed below for reference.    Significant Diagnostic Studies: No results found.  Labs: Basic Metabolic Panel: Recent Labs  Lab 11/14/19 1517 11/15/19 0520 11/17/19 0513  NA  --  139 140  K  --  3.9 3.6  CL  --  106 108  CO2  --  26 27  GLUCOSE  --  109* 96  BUN  --  8 8  CREATININE 0.75 0.64 0.63  CALCIUM  --  8.3* 8.2*   Liver Function Tests: No results for input(s): AST, ALT, ALKPHOS, BILITOT, PROT, ALBUMIN in the last 168 hours.  CBC: Recent Labs  Lab 11/14/19 1517 11/15/19 0520 11/17/19 0513  WBC 12.1* 7.1 3.9*   HGB 13.7 11.3* 10.0*  HCT 39.9 33.4* 29.7*  MCV 86.7 88.4 89.2  PLT PLATELET CLUMPS NOTED ON SMEAR, UNABLE TO ESTIMATE 242 190    CBG: No results for input(s): GLUCAP in the last 168 hours.  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

## 2019-11-17 NOTE — Progress Notes (Signed)
Gave Oxycodone 5 mg at 0804. Removed 2 tabs but only gave one. Resolved discrepancy in pyxis and wasted extra oxycodone in steri-cycle with Navreet, RN about 1400. Pharmacy told me to add pt in pyxis and waste med but was unable to.

## 2019-12-16 ENCOUNTER — Encounter (HOSPITAL_COMMUNITY): Payer: Self-pay

## 2019-12-16 ENCOUNTER — Emergency Department (HOSPITAL_COMMUNITY)
Admission: EM | Admit: 2019-12-16 | Discharge: 2019-12-17 | Disposition: A | Discharge status: Home / Self Care | Payer: Medicaid Other | Attending: Emergency Medicine | Admitting: Emergency Medicine

## 2019-12-16 ENCOUNTER — Emergency Department (HOSPITAL_COMMUNITY): Payer: Medicaid Other

## 2019-12-16 ENCOUNTER — Emergency Department (HOSPITAL_COMMUNITY)
Admission: EM | Admit: 2019-12-16 | Discharge: 2019-12-16 | Disposition: A | Discharge status: Home / Self Care | Payer: BC Managed Care – PPO | Attending: Emergency Medicine | Admitting: Emergency Medicine

## 2019-12-16 ENCOUNTER — Other Ambulatory Visit: Payer: Self-pay

## 2019-12-16 DIAGNOSIS — R1030 Lower abdominal pain, unspecified: Secondary | ICD-10-CM

## 2019-12-16 DIAGNOSIS — I10 Essential (primary) hypertension: Secondary | ICD-10-CM | POA: Insufficient documentation

## 2019-12-16 DIAGNOSIS — Z20822 Contact with and (suspected) exposure to covid-19: Secondary | ICD-10-CM | POA: Insufficient documentation

## 2019-12-16 DIAGNOSIS — M545 Low back pain: Secondary | ICD-10-CM | POA: Insufficient documentation

## 2019-12-16 DIAGNOSIS — Z79899 Other long term (current) drug therapy: Secondary | ICD-10-CM | POA: Insufficient documentation

## 2019-12-16 DIAGNOSIS — R1032 Left lower quadrant pain: Secondary | ICD-10-CM | POA: Insufficient documentation

## 2019-12-16 DIAGNOSIS — Z87891 Personal history of nicotine dependence: Secondary | ICD-10-CM | POA: Insufficient documentation

## 2019-12-16 DIAGNOSIS — Z5321 Procedure and treatment not carried out due to patient leaving prior to being seen by health care provider: Secondary | ICD-10-CM | POA: Insufficient documentation

## 2019-12-16 DIAGNOSIS — G8918 Other acute postprocedural pain: Secondary | ICD-10-CM | POA: Insufficient documentation

## 2019-12-16 DIAGNOSIS — Z9049 Acquired absence of other specified parts of digestive tract: Secondary | ICD-10-CM | POA: Insufficient documentation

## 2019-12-16 LAB — COMPREHENSIVE METABOLIC PANEL
ALT: 12 U/L (ref 0–44)
AST: 15 U/L (ref 15–41)
Albumin: 4.1 g/dL (ref 3.5–5.0)
Alkaline Phosphatase: 64 U/L (ref 38–126)
Anion gap: 8 (ref 5–15)
BUN: 9 mg/dL (ref 6–20)
CO2: 25 mmol/L (ref 22–32)
Calcium: 8.8 mg/dL — ABNORMAL LOW (ref 8.9–10.3)
Chloride: 106 mmol/L (ref 98–111)
Creatinine, Ser: 0.78 mg/dL (ref 0.44–1.00)
GFR calc Af Amer: >60 mL/min (ref >60)
GFR calc non Af Amer: >60 mL/min (ref >60)
Glucose, Bld: 99 mg/dL (ref 70–99)
Potassium: 3.3 mmol/L — ABNORMAL LOW (ref 3.5–5.1)
Sodium: 139 mmol/L (ref 135–145)
Total Bilirubin: 0.4 mg/dL (ref 0.3–1.2)
Total Protein: 6.9 g/dL (ref 6.5–8.1)

## 2019-12-16 LAB — CBC
HCT: 35.3 % — ABNORMAL LOW (ref 36.0–46.0)
Hemoglobin: 12 g/dL (ref 12.0–15.0)
MCH: 30.2 pg (ref 26.0–34.0)
MCHC: 34 g/dL (ref 30.0–36.0)
MCV: 88.7 fL (ref 80.0–100.0)
Platelets: 290 10*3/uL (ref 150–400)
RBC: 3.98 MIL/uL (ref 3.87–5.11)
RDW: 14.3 % (ref 11.5–15.5)
WBC: 3.7 10*3/uL — ABNORMAL LOW (ref 4.0–10.5)
nRBC: 0 % (ref 0.0–0.2)

## 2019-12-16 LAB — PREGNANCY, URINE: Preg Test, Ur: NEGATIVE

## 2019-12-16 LAB — URINALYSIS, ROUTINE W REFLEX MICROSCOPIC
Bilirubin Urine: NEGATIVE
Glucose, UA: NEGATIVE mg/dL
Hgb urine dipstick: NEGATIVE
Ketones, ur: NEGATIVE mg/dL
Leukocytes,Ua: NEGATIVE
Nitrite: NEGATIVE
Protein, ur: NEGATIVE mg/dL
Specific Gravity, Urine: 1.008 (ref 1.005–1.030)
pH: 6 (ref 5.0–8.0)

## 2019-12-16 LAB — LIPASE, BLOOD: Lipase: 68 U/L — ABNORMAL HIGH (ref 11–51)

## 2019-12-16 MED ORDER — IOHEXOL 300 MG/ML  SOLN
100.0000 mL | Freq: Once | INTRAMUSCULAR | Status: AC | PRN
  Administered 2019-12-16: 100 mL via INTRAVENOUS

## 2019-12-16 MED ORDER — FENTANYL CITRATE (PF) 100 MCG/2ML IJ SOLN
50.0000 ug | Freq: Once | INTRAMUSCULAR | Status: AC
  Administered 2019-12-16: 50 ug via INTRAVENOUS
  Filled 2019-12-16: qty 2
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
## 2019-12-17 LAB — SARS CORONAVIRUS 2 BY RT PCR (HOSPITAL ORDER, PERFORMED IN ~~LOC~~ HOSPITAL LAB): SARS Coronavirus 2: NEGATIVE

## 2019-12-17 MED ORDER — OXYCODONE HCL ER 10 MG PO T12A
10.0000 mg | EXTENDED_RELEASE_TABLET | Freq: Once | ORAL | Status: AC
  Administered 2019-12-17: 10 mg via ORAL
  Filled 2019-12-17: qty 1
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
## 2019-12-17 NOTE — Discharge Instructions (Signed)
Please contact your surgeon on Monday for follow up appointment on today's ER visit.   You may continue ibuprofen at home for symptom management.   Please return to the emergency department should your symptoms worsen or fail to improve, or you develop any new symptoms.

## 2020-07-23 ENCOUNTER — Emergency Department (HOSPITAL_COMMUNITY): Payer: BC Managed Care – PPO

## 2020-07-23 ENCOUNTER — Emergency Department (HOSPITAL_COMMUNITY)
Admission: EM | Admit: 2020-07-23 | Discharge: 2020-07-23 | Disposition: A | Discharge status: Home / Self Care | Payer: BC Managed Care – PPO | Attending: Emergency Medicine | Admitting: Emergency Medicine

## 2020-07-23 ENCOUNTER — Other Ambulatory Visit: Payer: Self-pay

## 2020-07-23 DIAGNOSIS — M25531 Pain in right wrist: Secondary | ICD-10-CM | POA: Diagnosis not present

## 2020-07-23 DIAGNOSIS — I10 Essential (primary) hypertension: Secondary | ICD-10-CM | POA: Diagnosis not present

## 2020-07-23 DIAGNOSIS — M25532 Pain in left wrist: Secondary | ICD-10-CM | POA: Diagnosis not present

## 2020-07-23 DIAGNOSIS — E079 Disorder of thyroid, unspecified: Secondary | ICD-10-CM | POA: Diagnosis not present

## 2020-07-23 DIAGNOSIS — Z79899 Other long term (current) drug therapy: Secondary | ICD-10-CM | POA: Diagnosis not present

## 2020-07-23 DIAGNOSIS — Z87891 Personal history of nicotine dependence: Secondary | ICD-10-CM | POA: Diagnosis not present

## 2020-07-23 MED ORDER — ACETAMINOPHEN 325 MG PO TABS: 650.0000 mg | ORAL_TABLET | Freq: Once | ORAL | Status: DC

## 2020-07-23 NOTE — ED Provider Notes (Signed)
[facility] EMERGENCY DEPARTMENT Provider Note   CSN: [id number] Arrival date & time: 07/23/20  1556     History Chief Complaint  Patient presents with  . Wrist Pain    Molly Anderson is a 55 y.o. female.  Patient presents chief complaint bilateral wrist pain ongoing for a month.  She denies any specific trauma or other incident.  But she states that she works with her hands typically at work a lot.  Denies fevers vomiting cough or diarrhea.  She was concerned that it was not getting any better and presents to the ER.        Past Medical History:  Diagnosis Date  . Depression   . Hypertension   . Hyperthyroidism   . MI (myocardial infarction) (HCC)    light 2008 no longer sees cardiologist saw for 1 year  . Thyroid disease     Patient Active Problem List   Diagnosis Date Noted  . 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 Colonic diverticular abscess   . Diverticulitis 08/11/2019  . Intra-abdominal abscess (HCC) 08/11/2019  . Hypokalemia 08/11/2019  . Essential hypertension 08/11/2019    Past Surgical History:  Procedure Laterality Date  . COLON RESECTION N/A 11/14/2019   Procedure: LAPAROSCOPIC PARTIAL COLECTOMY WITH ANASTOMOSIS WITH RIGID PROCTOSCOPY;  Surgeon: Sheliah Hatch De Blanch, MD;  Location: [facility];  Service: General;  Laterality: N/A;  . ECTOPIC PREGNANCY SURGERY Left      OB History   No obstetric history on file.     Family History  Problem Relation Age of Onset  . Dementia Father   . Diabetes Neg Hx   . Hypertension Neg Hx     Social History   Tobacco Use  . Smoking status: Former Smoker    Types: Cigarettes    Quit date: 04/09/2019    Years since quitting: 1.2  . Smokeless tobacco: Never Used  Vaping Use  . Vaping Use: Never used  Substance Use Topics  . Alcohol use: Not Currently  . Drug use: No    Home Medications Prior to Admission medications   Medication Sig Start Date End Date Taking? Authorizing  Provider  ibuprofen (ADVIL) 800 MG tablet Take 1 tablet (800 mg total) by mouth every 8 (eight) hours as needed. Patient taking differently: Take 800 mg by mouth every 8 (eight) hours as needed (pain).  11/17/19   Kinsinger, De Blanch, MD  lisinopril (ZESTRIL) 20 MG tablet Take 20 mg by mouth daily.  05/06/19   [provider]  oxyCODONE (OXY IR/ROXICODONE) 5 MG immediate release tablet Take 1 tablet (5 mg total) by mouth every 6 (six) hours as needed for severe pain. Patient not taking: Reported on 12/19/2019 11/17/19   Kinsinger, De Blanch, MD  Probiotic Product (MISC INTESTINAL FLORA REGULAT) CHEW Chew 1 tablet by mouth daily.    [provider]  sertraline (ZOLOFT) 25 MG tablet Take 25 mg by mouth daily. 05/06/19   [provider]    Allergies    Morphine and related  Review of Systems   Review of Systems  Constitutional: Negative for fever.  HENT: Negative for ear pain.   Eyes: Negative for pain.  Respiratory: Negative for cough.   Cardiovascular: Negative for chest pain.  Gastrointestinal: Negative for abdominal pain.  Genitourinary: Negative for flank pain.  Musculoskeletal: Negative for back pain.  Skin: Negative for rash.  Neurological: Negative for headaches.    Physical Exam Updated Vital Signs BP 131/89 (BP Location: Left  Arm)   Pulse 78   Temp 98.2 F (36.8 C)   Resp 18   SpO2 99%   Physical Exam Constitutional:      General: She is not in acute distress.    Appearance: Normal appearance.  HENT:     Head: Normocephalic.     Nose: Nose normal.  Eyes:     Extraocular Movements: Extraocular movements intact.  Cardiovascular:     Rate and Rhythm: Normal rate.  Pulmonary:     Effort: Pulmonary effort is normal.  Musculoskeletal:        General: Normal range of motion.     Cervical back: Normal range of motion.     Comments: Tenderness palpation bilateral wrists maximal in the ulnar volar surface.  No abnormal warmth or deformity or  neurovascular deficit noted.  Pulses intact 2+ distal radius bilaterally.  Neurological:     General: No focal deficit present.     Mental Status: She is alert. Mental status is at baseline.     ED Results / Procedures / Treatments   Labs (all labs ordered are listed, but only abnormal results are displayed) Labs Reviewed - No data to display  EKG None  Radiology DG Wrist Complete Left  Result Date: 07/23/2020 CLINICAL DATA:  pain, bilateral wrist pain for 1 month. No recent injuries EXAM: LEFT WRIST - COMPLETE 3+ VIEW COMPARISON:  None. FINDINGS: No evidence of acute fracture. There is positive ulnar variance with cystic change in the lunate and adjacent radius at the distal radioulnar joint. There is mild radiocarpal degenerative change. IMPRESSION: Findings compatible with ulnar impaction syndrome. No acute osseous abnormality. Electronically Signed   By: Caprice Renshaw   On: 07/23/2020 16:47   DG Wrist Complete Right  Result Date: 07/23/2020 CLINICAL DATA:  pain.  Pain for 1 month.  No recent injuries. EXAM: RIGHT WRIST - COMPLETE 3+ VIEW COMPARISON:  None. FINDINGS: There is no evidence of acute fracture. Mild radiocarpal degenerative change. Mild positive ulnar variance. IMPRESSION: No evidence of acute fracture. Mild radiocarpal degenerative change. Mild positive ulnar variance which can be seen with ulnar impaction syndrome. Electronically Signed   By: Caprice Renshaw   On: 07/23/2020 16:45    Procedures Procedures   Medications Ordered in ED Medications  acetaminophen (TYLENOL) tablet 650 mg (650 mg Oral Patient Refused/Not Given 07/23/20 1839)    ED Course  I have reviewed the triage vital signs and the nursing notes.  Pertinent labs & imaging results that were available during my care of the patient were reviewed by me and considered in my medical decision making (see chart for details).    MDM Rules/Calculators/A&P                          Compartments otherwise soft  bilateral extremities.  X-rays of the wrists show findings consistent with ulnar compression syndrome.  Patient advised outpatient follow-up with hand specialist.  Given Tylenol here for pain management.  Advised immediate return for worsening pain fevers or any additional concerns.  Final Clinical Impression(s) / ED Diagnoses Final diagnoses:  Pain in both wrists    Rx / DC Orders ED Discharge Orders    None       Cheryll Cockayne, MD 07/23/20 [phone]

## 2020-07-23 NOTE — ED Triage Notes (Signed)
Pt from home for eval of bilateral wrist pain, L worse than R, x 1 month. Denies injury.

## 2020-07-23 NOTE — Discharge Instructions (Addendum)
Call your primary care doctor or specialist as discussed in the next 2-3 days.   Return immediately back to the ER if:  Your symptoms worsen within the next 12-24 hours. You develop new symptoms such as new fevers, persistent vomiting, new pain, shortness of breath, or new weakness or numbness, or if you have any other concerns.  

## 2020-07-23 NOTE — ED Notes (Signed)
Pt refused dc VS 

## 2020-07-23 NOTE — ED Notes (Signed)
Pt is very frustrated and states she is ready to go and that she can just take tylenol at home. RN explained what AMA is. Pt understood and decided to stay

## 2020-07-23 NOTE — ED Triage Notes (Signed)
Emergency Medicine Provider Triage Evaluation Note  Molly Anderson , a 55 y.o. female  was evaluated in triage.  Pt complains of bilateral wrist pain x1 month (left worse than right). Works on motors with repetitive wrist motion. No fever or chills. No known injury. She is right hand dominant.  Review of Systems  Positive: arthralgia Negative: Fever/chills  Physical Exam  BP 131/89 (BP Location: Left Arm)   Pulse 78   Temp 98.2 F (36.8 C)   Resp 18   SpO2 99%  Gen:   Awake, no distress   HEENT:  Atraumatic  Resp:  Normal effort  Cardiac:  Normal rate  Abd:   Nondistended, nontender  MSK:   Tenderness throughout bilateral palmar aspect of bilateral wrist. Neuro:  Speech clear   Medical Decision Making  Medically screening exam initiated at 4:16 PM.  Appropriate orders placed.  Molly Anderson was informed that the remainder of the evaluation will be completed by another provider, this initial triage assessment does not replace that evaluation, and the importance of remaining in the ED until their evaluation is complete.  Clinical Impression  Bilateral wrist pain. X-rays ordered. Possible tendinitis.    Molly Anderson, [place] 07/23/20 [phone]

## 2020-10-03 ENCOUNTER — Ambulatory Visit: Payer: BC Managed Care – PPO | Admitting: Neurology

## 2021-03-07 IMAGING — CT CT ABD-PELV W/ CM
2 of 5 series · 16 of 46 positions shown, 18 images · IV contrast (APPLIED)
Comparison: Ten days ago

CLINICAL DATA: Acute nonlocalized abdominal pain

EXAM:
CT ABDOMEN AND PELVIS WITH CONTRAST
TECHNIQUE: Multidetector CT imaging of the abdomen and pelvis was performed
using the standard protocol following bolus administration of
intravenous contrast.
CONTRAST:  100mL OMNIPAQUE IOHEXOL 300 MG/ML  SOLN

[Series 3: abdomen 5.0 · axial · 0.74mm/px · z∈[+644,+1058]mm · 13 of 95 slices shown, 15 images]
[im 6/95  soft-tissue]
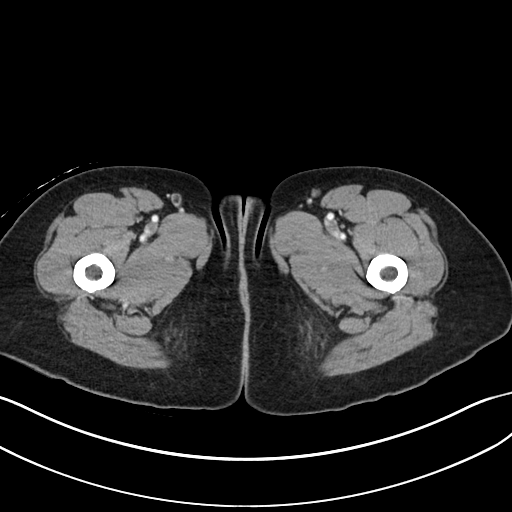
[im 6/95  bone]
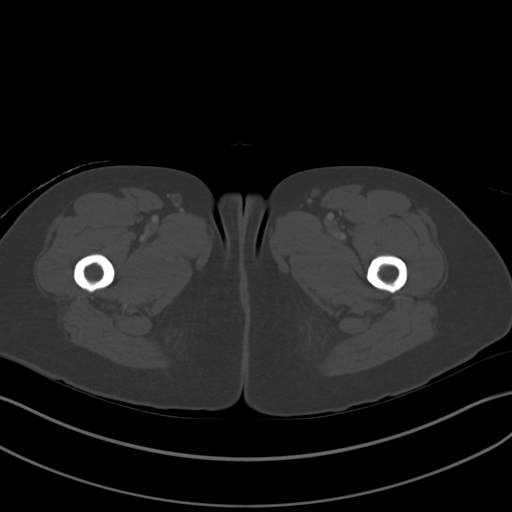
[im 12/95  soft-tissue]
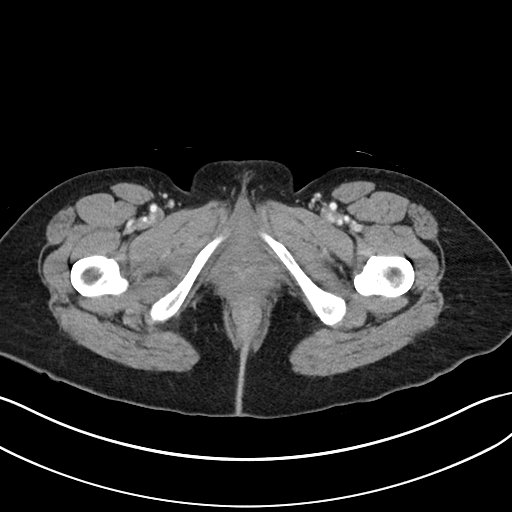
[im 23/95  soft-tissue]
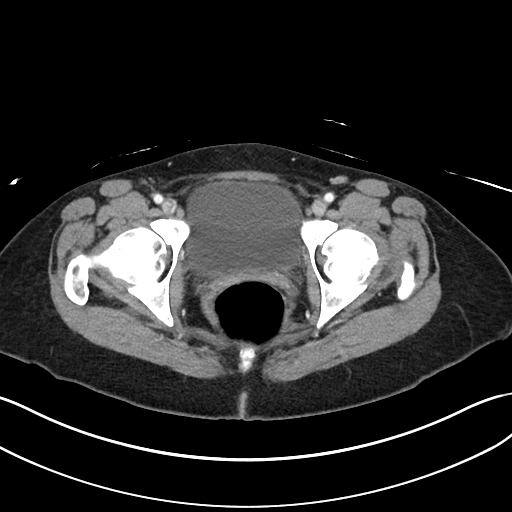
[im 28/95  soft-tissue]
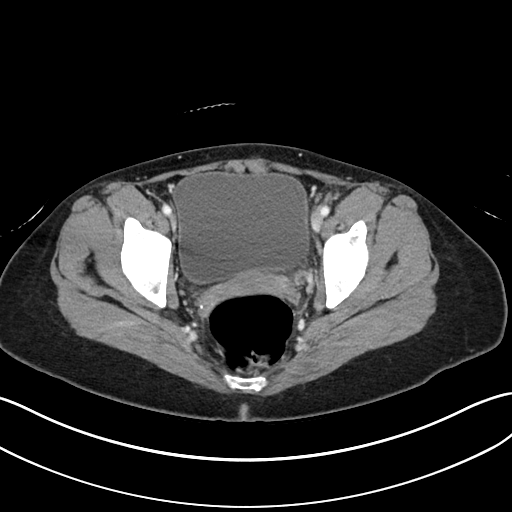
[im 34/95  soft-tissue]
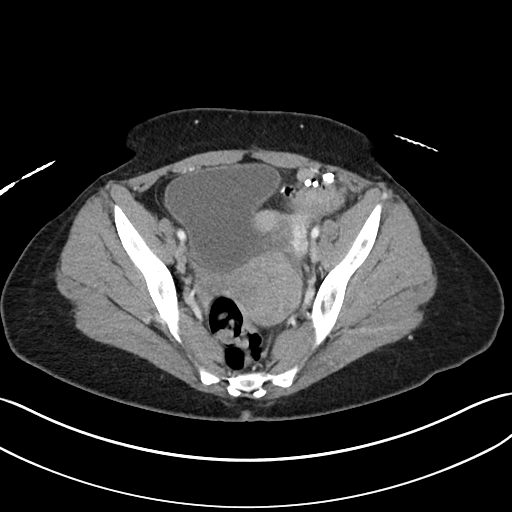
[im 39/95  soft-tissue]
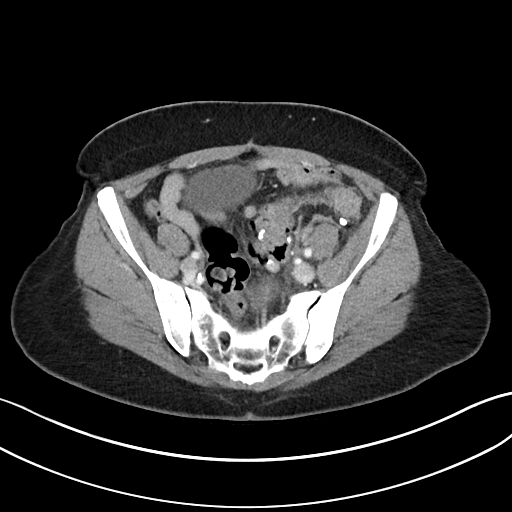
[im 50/95  soft-tissue]
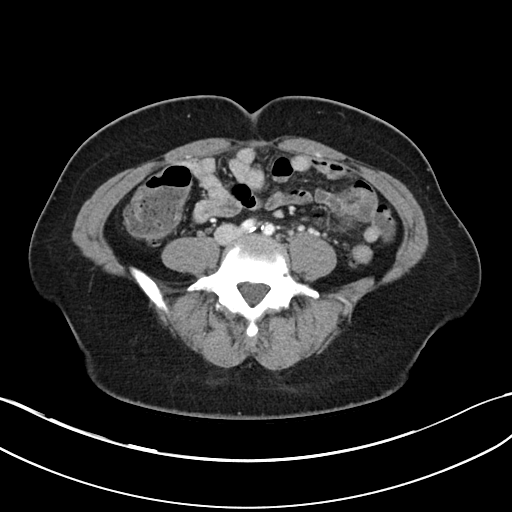
[im 56/95  soft-tissue]
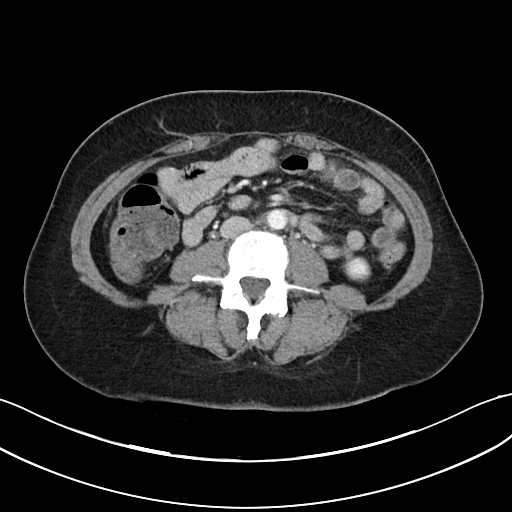
[im 61/95  soft-tissue]
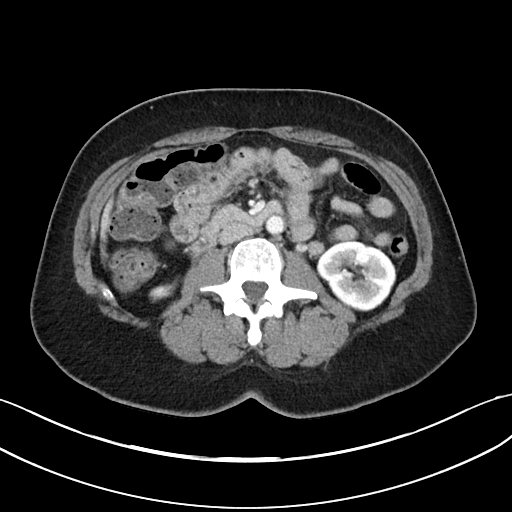
[im 61/95  bone]
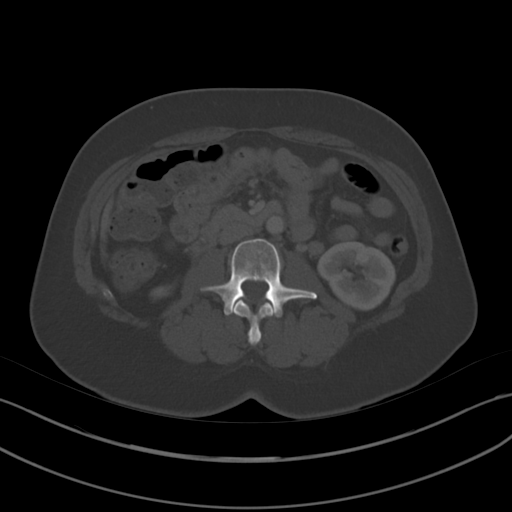
[im 67/95  soft-tissue]
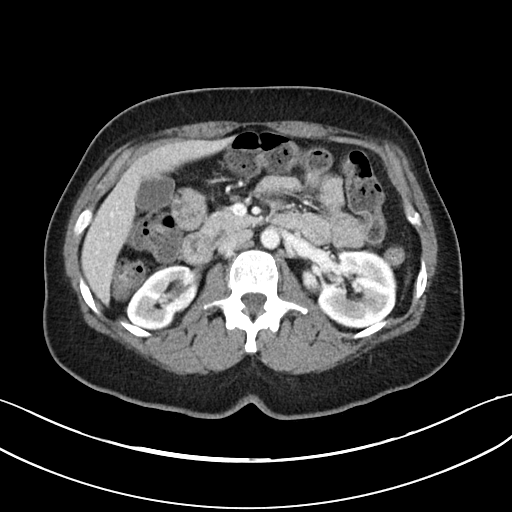
[im 72/95  soft-tissue]
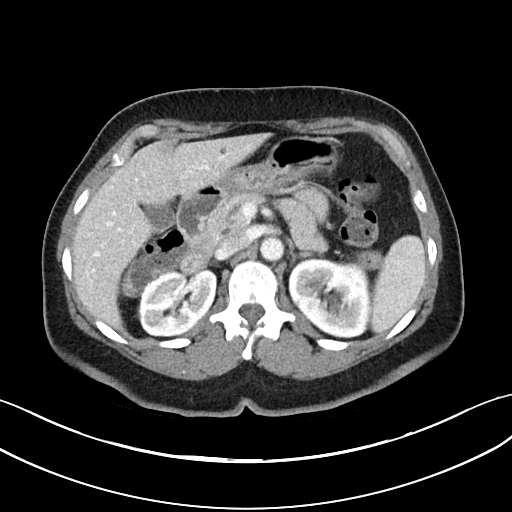
[im 83/95  soft-tissue]
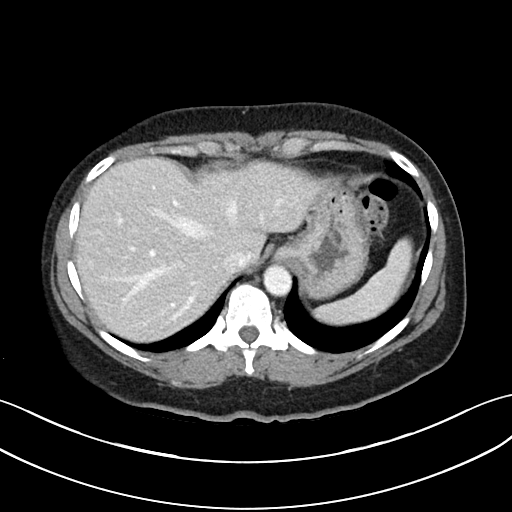
[im 89/95  soft-tissue]
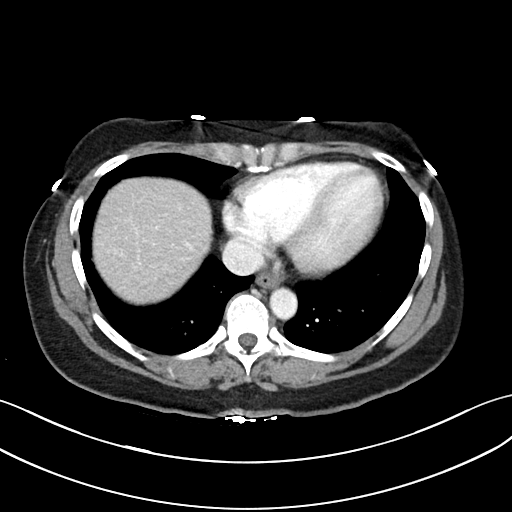

[Series 6: abdomen 3.0 mpr cor · coronal · 0.75mm/px · 3 of 84 slices shown]
[im 28/84  soft-tissue]
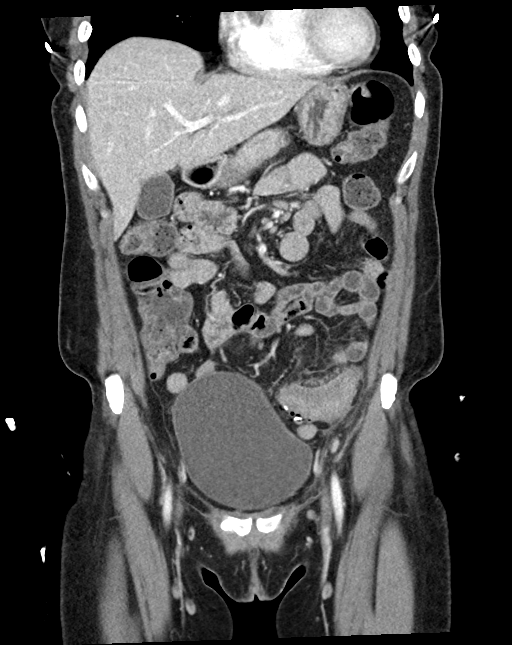
[im 37/84  soft-tissue]
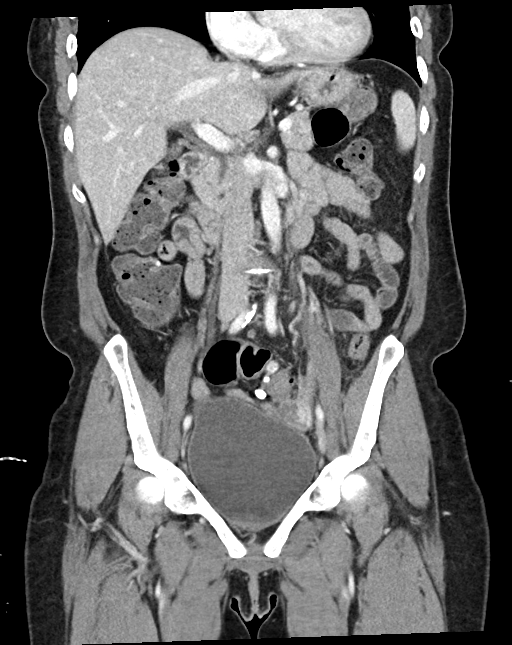
[im 47/84  soft-tissue]
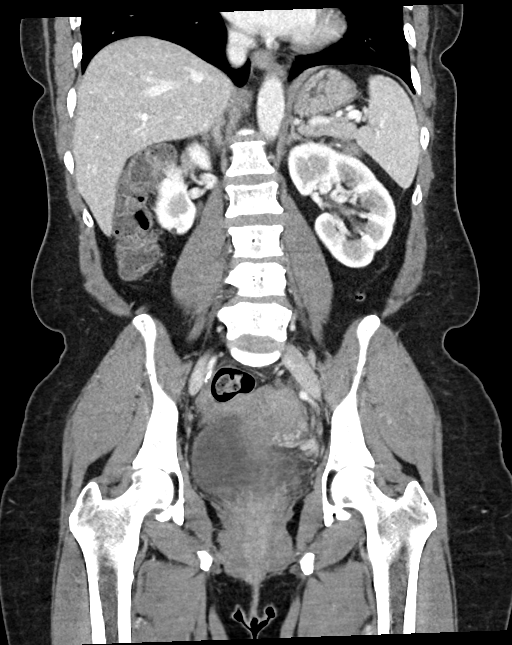

[16 of 46 positions shown; findings below may reference images not displayed]

FINDINGS: Lower chest:  No contributory findings.

Hepatobiliary: No focal liver abnormality.No evidence of biliary
obstruction or stone.

Pancreas: Unremarkable.

Spleen: Unremarkable.

Adrenals/Urinary Tract: Negative adrenals. No hydronephrosis or
stone. Relative right renal cortical thinning with patchy lower pole
scarring. Unremarkable bladder.

Stomach/Bowel: Segment of proximal sigmoid colonic wall thickening
(with submucosal low-density) with regional peritoneal thickening
and mild fat reticulation. There are multiple colonic diverticula at
this level. No obstruction. No appendicitis.

Vascular/Lymphatic: No acute vascular abnormality. Aortic and iliac
atherosclerotic calcification. No mass or adenopathy.

Reproductive:Negative

Other: No ascites or pneumoperitoneum.

Musculoskeletal: No acute abnormalities.
IMPRESSION: 1. Unchanged sigmoid wall thickening with regional fat stranding and
peritoneal thickening favoring inflammatory process (especially
diverticulitis) rather than mass. Recommend GI follow-up for
colonoscopy.
2. Right renal scarring.

## 2021-05-24 ENCOUNTER — Other Ambulatory Visit: Payer: Self-pay

## 2021-05-24 ENCOUNTER — Emergency Department (HOSPITAL_BASED_OUTPATIENT_CLINIC_OR_DEPARTMENT_OTHER)
Admission: EM | Admit: 2021-05-24 | Discharge: 2021-05-24 | Disposition: A | Discharge status: Home / Self Care | Payer: 59 | Attending: Emergency Medicine | Admitting: Emergency Medicine

## 2021-05-24 ENCOUNTER — Encounter (HOSPITAL_BASED_OUTPATIENT_CLINIC_OR_DEPARTMENT_OTHER): Payer: Self-pay | Admitting: *Deleted

## 2021-05-24 DIAGNOSIS — U071 COVID-19: Secondary | ICD-10-CM | POA: Insufficient documentation

## 2021-05-24 DIAGNOSIS — R519 Headache, unspecified: Secondary | ICD-10-CM | POA: Diagnosis present

## 2021-05-24 LAB — RESP PANEL BY RT-PCR (FLU A&B, COVID) ARPGX2
Influenza A by PCR: NEGATIVE
Influenza B by PCR: NEGATIVE
SARS Coronavirus 2 by RT PCR: POSITIVE — AB

## 2021-05-24 NOTE — ED Provider Notes (Signed)
[facility] EMERGENC[facility] DEPT Provider Note   CSN: [id number] Arrival date & time: 05/24/21  1144     History Chief Complaint  Patient presents with   Covid Positive    Molly Anderson is a 56 y.o. female with no significant past medical history who presents to the emergency department for a COVID test.  Patient tested positive on Wednesday with an at-home COVID test.  She reports associated headache, general malaise, chills, diarrhea.  No cough, congestion, chest pain, sore throat, shortness of breath, nausea or vomiting.  Patient states that her work requesting official COVID test which prompted her arrival today.  Outpatient follow-up outpatient follow-up       Home Medications Prior to Admission medications   Medication Sig Start Date End Date Taking? Authorizing Provider  ibuprofen (ADVIL) 800 MG tablet Take 1 tablet (800 mg total) by mouth every 8 (eight) hours as needed. Patient taking differently: Take 800 mg by mouth every 8 (eight) hours as needed (pain).  11/17/19   Kinsinger, Arta Bruce, MD  lisinopril (ZESTRIL) 20 MG tablet Take 20 mg by mouth daily.  05/06/19   [provider]  oxyCODONE (OXY IR/ROXICODONE) 5 MG immediate release tablet Take 1 tablet (5 mg total) by mouth every 6 (six) hours as needed for severe pain. Patient not taking: Reported on 12/19/2019 11/17/19   Kinsinger, Arta Bruce, MD  Probiotic Product (MISC INTESTINAL FLORA REGULAT) CHEW Chew 1 tablet by mouth daily.    [provider]  sertraline (ZOLOFT) 25 MG tablet Take 25 mg by mouth daily. 05/06/19   [provider]      Allergies    Morphine and related    Review of Systems   Review of Systems  All other systems reviewed and are negative.  Physical Exam Updated Vital Signs BP (!) 146/97 (BP Location: Right Arm)    Pulse (!) 103    Temp 99.6 F (37.6 C) (Oral)    Resp 16    SpO2 100%  Physical Exam Vitals and nursing note reviewed.  Constitutional:       Appearance: Normal appearance.  HENT:     Head: Normocephalic and atraumatic.  Eyes:     General:        Right eye: No discharge.        Left eye: No discharge.     Conjunctiva/sclera: Conjunctivae normal.  Cardiovascular:     Rate and Rhythm: Normal rate.  Pulmonary:     Effort: Pulmonary effort is normal.     Breath sounds: Normal breath sounds.  Abdominal:     General: Abdomen is flat. Bowel sounds are normal.     Tenderness: There is no abdominal tenderness.  Skin:    General: Skin is warm and dry.     Findings: No rash.  Neurological:     General: No focal deficit present.     Mental Status: She is alert.  Psychiatric:        Mood and Affect: Mood normal.        Behavior: Behavior normal.    ED Results / Procedures / Treatments   Labs (all labs ordered are listed, but only abnormal results are displayed) Labs Reviewed  RESP PANEL BY RT-PCR (FLU A&B, COVID) ARPGX2 - Abnormal; Notable for the following components:      Result Value   SARS Coronavirus 2 by RT PCR POSITIVE (*)    All other components within normal limits    EKG None  Radiology  No results found.  Procedures Procedures    Medications Ordered in ED Medications - No data to display  ED Course/ Medical Decision Making/ A&P                           Medical Decision Making  This patient presents to the ED for a repeat COVID test, this involves an extensive number of treatment options, and is a complaint that carries with it a high risk of complications and morbidity.  The differential diagnosis includes COVID.   Co morbidities that complicate the patient evaluation  None   Additional history obtained:  Additional history obtained from nursing note   Lab Tests:  I Ordered, and personally interpreted labs.  The pertinent results include: Respiratory panel which was positive for COVID.   Cardiac Monitoring:  The patient was maintained on a cardiac monitor.  I personally viewed and  interpreted the cardiac monitored which showed an underlying rhythm of: Tachycardic with a normal rhythm.     Test Considered:  Chest Xray. Patient has no respiratory symptoms and her lungs are CTA bilaterally. Do not feel that it would be of high yield at this time.    Critical Interventions:  None   Problem List / ED Course:  General malaise, headache, diarrhea.  Likely COVID considering the patient did test positive at home.   Reevaluation:  After the interventions noted above, I reevaluated the patient and found that they have :stayed the same   Social Determinants of Health:  None   Dispostion:  After consideration of the diagnostic results and the patients response to treatment, I feel that the patent would benefit from outpatient follow up. Clinically, patient is well appearing and in no acute distress at this time. Vitals are normal. She is safe for discharge.  Final Clinical Impression(s) / ED Diagnoses Final diagnoses:  COVID    Rx / DC Orders ED Discharge Orders     None         Cherrie Gauze 05/24/21 1307    Blanchie Dessert, MD 05/25/21 1436

## 2021-05-24 NOTE — Discharge Instructions (Addendum)
Your COVID test was positive.

## 2021-05-24 NOTE — ED Triage Notes (Signed)
Pt is here for a covid test for work.  She had a positive test at home on Wednesday but work needs an Environmental manager.
